# Patient Record
Sex: Female | Born: 2000 | Hispanic: Yes | Marital: Single | Smoking: Never smoker
Health system: Southern US, Community
[De-identification: ages and names within clinical notes are randomized; demographics above are authoritative.]

## PROBLEM LIST (undated history)

## (undated) DIAGNOSIS — Z973 Presence of spectacles and contact lenses: Secondary | ICD-10-CM

## (undated) DIAGNOSIS — R1900 Intra-abdominal and pelvic swelling, mass and lump, unspecified site: Secondary | ICD-10-CM

## (undated) HISTORY — PX: NO PAST SURGERIES: SHX2092

---
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
## 2019-07-29 DIAGNOSIS — Z419 Encounter for procedure for purposes other than remedying health state, unspecified: Secondary | ICD-10-CM | POA: Diagnosis not present

## 2019-08-29 DIAGNOSIS — Z419 Encounter for procedure for purposes other than remedying health state, unspecified: Secondary | ICD-10-CM | POA: Diagnosis not present

## 2019-09-29 DIAGNOSIS — Z419 Encounter for procedure for purposes other than remedying health state, unspecified: Secondary | ICD-10-CM | POA: Diagnosis not present

## 2019-10-29 DIAGNOSIS — Z419 Encounter for procedure for purposes other than remedying health state, unspecified: Secondary | ICD-10-CM | POA: Diagnosis not present

## 2019-11-29 DIAGNOSIS — Z419 Encounter for procedure for purposes other than remedying health state, unspecified: Secondary | ICD-10-CM | POA: Diagnosis not present

## 2019-12-29 DIAGNOSIS — Z419 Encounter for procedure for purposes other than remedying health state, unspecified: Secondary | ICD-10-CM | POA: Diagnosis not present

## 2020-01-29 DIAGNOSIS — Z419 Encounter for procedure for purposes other than remedying health state, unspecified: Secondary | ICD-10-CM | POA: Diagnosis not present

## 2020-02-11 DIAGNOSIS — Z1152 Encounter for screening for COVID-19: Secondary | ICD-10-CM | POA: Diagnosis not present

## 2020-02-29 DIAGNOSIS — Z419 Encounter for procedure for purposes other than remedying health state, unspecified: Secondary | ICD-10-CM | POA: Diagnosis not present

## 2020-03-28 DIAGNOSIS — Z419 Encounter for procedure for purposes other than remedying health state, unspecified: Secondary | ICD-10-CM | POA: Diagnosis not present

## 2020-04-28 DIAGNOSIS — Z419 Encounter for procedure for purposes other than remedying health state, unspecified: Secondary | ICD-10-CM | POA: Diagnosis not present

## 2020-05-28 DIAGNOSIS — Z419 Encounter for procedure for purposes other than remedying health state, unspecified: Secondary | ICD-10-CM | POA: Diagnosis not present

## 2020-06-28 DIAGNOSIS — Z419 Encounter for procedure for purposes other than remedying health state, unspecified: Secondary | ICD-10-CM | POA: Diagnosis not present

## 2020-07-28 DIAGNOSIS — Z419 Encounter for procedure for purposes other than remedying health state, unspecified: Secondary | ICD-10-CM | POA: Diagnosis not present

## 2020-08-28 DIAGNOSIS — Z419 Encounter for procedure for purposes other than remedying health state, unspecified: Secondary | ICD-10-CM | POA: Diagnosis not present

## 2020-09-28 DIAGNOSIS — Z419 Encounter for procedure for purposes other than remedying health state, unspecified: Secondary | ICD-10-CM | POA: Diagnosis not present

## 2020-10-28 DIAGNOSIS — Z419 Encounter for procedure for purposes other than remedying health state, unspecified: Secondary | ICD-10-CM | POA: Diagnosis not present

## 2020-11-28 DIAGNOSIS — Z419 Encounter for procedure for purposes other than remedying health state, unspecified: Secondary | ICD-10-CM | POA: Diagnosis not present

## 2020-12-10 ENCOUNTER — Encounter (HOSPITAL_COMMUNITY): Payer: Self-pay | Admitting: Emergency Medicine

## 2020-12-10 ENCOUNTER — Emergency Department (HOSPITAL_COMMUNITY)
Admission: EM | Admit: 2020-12-10 | Discharge: 2020-12-11 | Disposition: A | Discharge status: Home / Self Care | Payer: Medicaid Other | Attending: Emergency Medicine | Admitting: Emergency Medicine

## 2020-12-10 ENCOUNTER — Other Ambulatory Visit: Payer: Self-pay

## 2020-12-10 DIAGNOSIS — R110 Nausea: Secondary | ICD-10-CM | POA: Insufficient documentation

## 2020-12-10 DIAGNOSIS — N83201 Unspecified ovarian cyst, right side: Secondary | ICD-10-CM | POA: Diagnosis not present

## 2020-12-10 DIAGNOSIS — R1905 Periumbilic swelling, mass or lump: Secondary | ICD-10-CM | POA: Diagnosis not present

## 2020-12-10 DIAGNOSIS — N1330 Unspecified hydronephrosis: Secondary | ICD-10-CM | POA: Diagnosis not present

## 2020-12-10 DIAGNOSIS — R1033 Periumbilical pain: Secondary | ICD-10-CM | POA: Diagnosis present

## 2020-12-10 DIAGNOSIS — R1900 Intra-abdominal and pelvic swelling, mass and lump, unspecified site: Secondary | ICD-10-CM | POA: Diagnosis not present

## 2020-12-10 LAB — COMPREHENSIVE METABOLIC PANEL
ALT: 13 U/L (ref 0–44)
AST: 18 U/L (ref 15–41)
Albumin: 3.9 g/dL (ref 3.5–5.0)
Alkaline Phosphatase: 68 U/L (ref 38–126)
Anion gap: 7 (ref 5–15)
BUN: 13 mg/dL (ref 6–20)
CO2: 25 mmol/L (ref 22–32)
Calcium: 9.1 mg/dL (ref 8.9–10.3)
Chloride: 105 mmol/L (ref 98–111)
Creatinine, Ser: 0.65 mg/dL (ref 0.44–1.00)
GFR, Estimated: >60 mL/min (ref >60)
Glucose, Bld: 88 mg/dL (ref 70–99)
Potassium: 3.7 mmol/L (ref 3.5–5.1)
Sodium: 137 mmol/L (ref 135–145)
Total Bilirubin: 0.7 mg/dL (ref 0.3–1.2)
Total Protein: 7.6 g/dL (ref 6.5–8.1)

## 2020-12-10 LAB — CBC WITH DIFFERENTIAL/PLATELET
Abs Immature Granulocytes: 0.03 10*3/uL (ref 0.00–0.07)
Basophils Absolute: 0 10*3/uL (ref 0.0–0.1)
Basophils Relative: 0 %
Eosinophils Absolute: 0.1 10*3/uL (ref 0.0–0.5)
Eosinophils Relative: 1 %
HCT: 40.3 % (ref 36.0–46.0)
Hemoglobin: 12.4 g/dL (ref 12.0–15.0)
Immature Granulocytes: 0 %
Lymphocytes Relative: 26 %
Lymphs Abs: 2.7 10*3/uL (ref 0.7–4.0)
MCH: 24.2 pg — ABNORMAL LOW (ref 26.0–34.0)
MCHC: 30.8 g/dL (ref 30.0–36.0)
MCV: 78.6 fL — ABNORMAL LOW (ref 80.0–100.0)
Monocytes Absolute: 0.6 10*3/uL (ref 0.1–1.0)
Monocytes Relative: 6 %
Neutro Abs: 6.6 10*3/uL (ref 1.7–7.7)
Neutrophils Relative %: 67 %
Platelets: 383 10*3/uL (ref 150–400)
RBC: 5.13 MIL/uL — ABNORMAL HIGH (ref 3.87–5.11)
RDW: 14.2 % (ref 11.5–15.5)
WBC: 10.1 10*3/uL (ref 4.0–10.5)
nRBC: 0 % (ref 0.0–0.2)

## 2020-12-10 LAB — I-STAT BETA HCG BLOOD, ED (MC, WL, AP ONLY): I-stat hCG, quantitative: <5 m[IU]/mL (ref <5)

## 2020-12-10 LAB — LIPASE, BLOOD: Lipase: 39 U/L (ref 11–51)

## 2020-12-10 NOTE — ED Triage Notes (Signed)
Pt c/o "lump" to the right of her belly button when lying flat. States she noticed it one week ago. Denies nausea/vomiting/diarrhea.

## 2020-12-10 NOTE — ED Provider Notes (Signed)
Emergency Medicine Provider Triage Evaluation Note  Jocelyn Howard , a 20 y.o. female  was evaluated in triage.  Pt complains of "knot" to periumbilical region that she noticed 1 week ago.  She states that she noticed this while laying flat.  She believes it has gotten bigger in size since then.  She states it is painful when she lays flat on her stomach.  She denies any nausea, vomiting, diarrhea, constipation.  No previous abdominal surgeries.  Review of Systems  Positive: + abd pain/knot Negative: - nausea, vomiting, diarrhea, constipation, fevers  Physical Exam  BP (!) 155/90 (BP Location: Right Arm)   Pulse 86   Temp 98.3 F (36.8 C) (Oral)   Resp 16   LMP 11/20/2020   SpO2 100%  Gen:   Awake, no distress   Resp:  Normal effort  MSK:   Moves extremities without difficulty  Other:  Abd soft, non tender. Difficult to assess as pt cannot lay flat in triage chair.   Medical Decision Making  Medically screening exam initiated at 6:16 PM.  Appropriate orders placed.  Jocelyn Howard was informed that the remainder of the evaluation will be completed by another provider, this initial triage assessment does not replace that evaluation, and the importance of remaining in the ED until their evaluation is complete.     Eustaquio Maize, PA-C 12/10/20 1817    Godfrey Pick, MD 12/11/20 [phone]

## 2020-12-11 ENCOUNTER — Emergency Department (HOSPITAL_COMMUNITY): Payer: Medicaid Other

## 2020-12-11 DIAGNOSIS — N1330 Unspecified hydronephrosis: Secondary | ICD-10-CM | POA: Diagnosis not present

## 2020-12-11 DIAGNOSIS — N83201 Unspecified ovarian cyst, right side: Secondary | ICD-10-CM | POA: Diagnosis not present

## 2020-12-11 DIAGNOSIS — R1900 Intra-abdominal and pelvic swelling, mass and lump, unspecified site: Secondary | ICD-10-CM | POA: Diagnosis not present

## 2020-12-11 LAB — LACTATE DEHYDROGENASE: LDH: 153 U/L (ref 98–192)

## 2020-12-11 MED ORDER — ONDANSETRON HCL 4 MG PO TABS
4.0000 mg | ORAL_TABLET | Freq: Once | ORAL | Status: AC
  Administered 2020-12-11: 4 mg via ORAL
  Filled 2020-12-11: qty 1

## 2020-12-11 MED ORDER — IOHEXOL 300 MG/ML  SOLN
100.0000 mL | Freq: Once | INTRAMUSCULAR | Status: AC | PRN
  Administered 2020-12-11: 100 mL via INTRAVENOUS

## 2020-12-11 NOTE — ED Notes (Signed)
Patient transported to CT 

## 2020-12-11 NOTE — ED Provider Notes (Addendum)
[facility] EMERGENCY DEPARTMENT Provider Note   CSN: [id number] Arrival date & time: 12/10/20  1623     History Chief Complaint  Patient presents with   Abdominal Pain    Jocelyn Howard is a 20 y.o. female with no significant medical history who presents to ED complaining of a "knot" in her periumbilical region for the last week. Patient denies pain to area but states that it is uncomfortable and is worsened by lying flat on her stomach. 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 When asked what made the patient decide to come in to ED today, she stated she "just wanted to get it checked out". Patient endorses nausea. Patient states she still has an appetite, eating regular meals and using the bathroom normally. Patient denies vomiting, constipation, diarrhea, fever.    Abdominal Pain Associated symptoms: nausea   Associated symptoms: no chest pain, no constipation, no diarrhea, no shortness of breath and no vomiting       History reviewed. No pertinent past medical history.  There are no problems to display for this patient.   History reviewed. No pertinent surgical history.   OB History   No obstetric history on file.     No family history on file.     Home Medications Prior to Admission medications   Not on File    Allergies    Patient has no known allergies.  Review of Systems   Review of Systems  Constitutional:  Negative for appetite change.  Respiratory:  Negative for shortness of breath.   Cardiovascular:  Negative for chest pain.  Gastrointestinal:  Positive for abdominal pain and nausea. Negative for constipation, diarrhea and vomiting.  Skin:  Negative for color change.  All other systems reviewed and are negative.  Physical Exam Updated Vital Signs BP 131/83 (BP Location: Right Arm)   Pulse 82   Temp 97.9 F (36.6 C) (Oral)   Resp 17   LMP  11/20/2020   SpO2 99%   Physical Exam Vitals and nursing note reviewed.  Constitutional:      General: She is not in acute distress.    Appearance: She is not ill-appearing.  HENT:     Head: Normocephalic.  Cardiovascular:     Rate and Rhythm: Normal rate and regular rhythm.     Heart sounds: No murmur heard. Pulmonary:     Effort: Pulmonary effort is normal.     Breath sounds: Normal breath sounds. No wheezing.  Abdominal:     General: Abdomen is flat. Bowel sounds are normal. There is no distension.     Tenderness: There is no abdominal tenderness.  Skin:    General: Skin is warm and dry.     Capillary Refill: Capillary refill takes less than 2 seconds.  Neurological:     Mental Status: She is alert.  Psychiatric:        Mood and Affect: Mood normal.    ED Results / Procedures / Treatments   Labs (all labs ordered are listed, but only abnormal results are displayed) Labs Reviewed  CBC WITH DIFFERENTIAL/PLATELET - Abnormal; Notable for the following components:      Result Value   RBC 5.13 (*)    MCV 78.6 (*)    MCH 24.2 (*)    All other components within normal limits  COMPREHENSIVE METABOLIC PANEL  LIPASE, BLOOD  CEA  CA 125  LACTATE DEHYDROGENASE  AFP TUMOR MARKER  INHIBIN B  I-STAT BETA HCG BLOOD, ED ([facility], [facility], [facility] ONLY)    EKG None  Radiology CT ABDOMEN PELVIS W CONTRAST  Result Date: 12/11/2020 CLINICAL DATA:  Palpable tender abdominal mass for 1 week. EXAM: CT ABDOMEN AND PELVIS WITH CONTRAST TECHNIQUE: Multidetector CT imaging of the abdomen and pelvis was performed using the standard protocol following bolus administration of intravenous contrast. CONTRAST:  138mL OMNIPAQUE IOHEXOL 300 MG/ML  SOLN COMPARISON:  None. FINDINGS: Lower Chest: No acute findings. Hepatobiliary: No hepatic masses identified. Gallbladder is unremarkable. No evidence of biliary ductal dilatation. Pancreas:  No mass or inflammatory changes. Spleen: Within normal limits in size and  appearance. Adrenals/Urinary Tract: No masses identified. No evidence of ureteral calculi. Mild right hydroureteronephrosis is seen due to extrinsic compression of the pelvic portion of the ureter by the cystic adnexal mass described below. Stomach/Bowel: No evidence of obstruction, inflammatory process or abnormal fluid collections. Vascular/Lymphatic: No pathologically enlarged lymph nodes. No acute vascular findings. Reproductive: Normal appearance of uterus. A large cystic lesion is seen in the right lower abdomen and pelvis, which appears to arise from the right adnexa. This measures 16.6 x 14.5 cm. A small mural nodule is seen along the posterior wall measuring approximately 14 mm. No other complex features are seen. 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 No other pelvic masses identified. No evidence of free fluid. Other:  None. Musculoskeletal:  No suspicious bone lesions identified. IMPRESSION: 16.6 cm cystic lesion in right lower abdomen and pelvis with small mural soft tissue nodule. 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 GYN consultation recommended. Mild right hydroureteronephrosis due to extrinsic compression by the cystic adnexal mass. Electronically Signed   By: Marlaine Hind M.D.   On: 12/11/2020 10:32     Procedures Procedures   Medications Ordered in ED Medications  ondansetron (ZOFRAN) tablet 4 mg (4 mg Oral Given 12/11/20 0859)  iohexol (OMNIPAQUE) 300 MG/ML solution 100 mL (100 mLs Intravenous Contrast Given 12/11/20 1006)    ED Course  I have reviewed the triage vital signs and the nursing notes.  Pertinent labs & imaging results that were available during my care of the patient were reviewed by me and considered in my medical decision making (see chart for details).    MDM Rules/Calculators/A&P                          20 year old female presents with  periumbilical mass noticed in the last week.  Patient states that it was uncomfortable to lie flat on her bed so she decided to "come and get checked out.".  On examination patient has large size mass that extends from periumbilical region into right lower quadrant.  Mass is not tender to palpation. Due to location of mass differential diagnosis at this time includes hernia, uterine fibroids.  Patient will be sent for CT imaging.  CT imaging revealing of 16.6 cm cystic lesion in right lower abdomen and pelvis with small mural soft tissue nodule.  According to CT read off this is high suspicious for a right ovarian cystic neoplasm, likely a serous cystoadenoma or cystoadenofibroma.  GYN will be consulted.  Dr. Roselie Awkward with GYN has been consulted on this patient.  He advises her to follow-up outpatient with [facility] [place] [facility] for planning of any potential biopsies that  need to be completed.   Additional tumor markers including AFP, CA125, CEA, and inhibin B have been ordered as requested by GYN.   Results of scan of been explained to patient including the fact that we are not sure if this is a benign or cancerous mass.  The follow-up plan has been explained to her as well and she is agreeable to plan.  Patient is stable on discharge       Final Clinical Impression(s) / ED Diagnoses Final diagnoses:  Pelvic mass in female    Rx / DC Orders ED Discharge Orders     None        Azucena Cecil, [place] 12/11/20 1306         Azucena Cecil, [place] 12/14/20 1814    Lucrezia Starch, MD 12/15/20 2037

## 2020-12-11 NOTE — Discharge Instructions (Addendum)
Return to ED with any new or worsening symptoms such as intractable nausea or vomiting You should receive a phone call from GYN office, but if you do not within 2 days please call number attached to this discharge packet to schedule follow up appointment

## 2020-12-12 LAB — CEA: CEA: 0.9 ng/mL (ref 0.0–4.7)

## 2020-12-12 LAB — CA 125: Cancer Antigen (CA) 125: 10.4 U/mL (ref 0.0–38.1)

## 2020-12-12 LAB — AFP TUMOR MARKER: AFP, Serum, Tumor Marker: <1.8 ng/mL (ref 0.0–4.7)

## 2020-12-13 LAB — INHIBIN B: Inhibin B: 7.1 pg/mL

## 2020-12-28 DIAGNOSIS — Z419 Encounter for procedure for purposes other than remedying health state, unspecified: Secondary | ICD-10-CM | POA: Diagnosis not present

## 2021-01-09 ENCOUNTER — Encounter: Payer: Medicaid Other | Admitting: Family Medicine

## 2021-01-10 ENCOUNTER — Encounter (HOSPITAL_COMMUNITY): Payer: Self-pay | Admitting: Radiology

## 2021-01-28 DIAGNOSIS — Z419 Encounter for procedure for purposes other than remedying health state, unspecified: Secondary | ICD-10-CM | POA: Diagnosis not present

## 2021-02-16 ENCOUNTER — Encounter: Payer: Self-pay | Admitting: Obstetrics & Gynecology

## 2021-02-16 ENCOUNTER — Ambulatory Visit (INDEPENDENT_AMBULATORY_CARE_PROVIDER_SITE_OTHER): Payer: Medicaid Other | Admitting: Obstetrics & Gynecology

## 2021-02-16 ENCOUNTER — Other Ambulatory Visit: Payer: Self-pay

## 2021-02-16 VITALS — BP 138/77 | HR 62 | Wt 210.0 lb

## 2021-02-16 DIAGNOSIS — R1900 Intra-abdominal and pelvic swelling, mass and lump, unspecified site: Secondary | ICD-10-CM

## 2021-02-16 NOTE — H&P (View-Only) (Signed)
GYNECOLOGY OFFICE VISIT NOTE  History:   Jocelyn Howard is a 21 y.o. G0 here today for management of large pelvic cystic mass diagnosed during ER visit on 12/11/2020. 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 She had negative CA-125, CEA LDH, AFP, inhibin tumor markers.  Today, she denies any pain, still notices the mass especially when she lies flat. She is concerned it may be bigger.   She denies any abnormal vaginal discharge, bleeding, pelvic pain or other concerns.    History reviewed. No pertinent past medical history.  History reviewed. No pertinent surgical history.  The following portions of the patient's history were reviewed and updated as appropriate: allergies, current medications, past family history, past medical history, past social history, past surgical history and problem list.   Health Maintenance:  Never had a pap smear, deferred today.  Review of Systems:  Pertinent items noted in HPI and remainder of comprehensive ROS otherwise negative.  Physical Exam:  BP 138/77    Pulse 62    Wt 210 lb (95.3 kg)    LMP 02/08/2021  CONSTITUTIONAL: Well-developed, well-nourished female in no acute distress.  HEENT:  Normocephalic, atraumatic. External right and left ear normal. No scleral icterus.  NECK: Normal range of motion, supple, no masses noted on observation SKIN: No rash noted. Not diaphoretic. No erythema. No pallor. MUSCULOSKELETAL: Normal range of motion. No edema noted. NEUROLOGIC: Alert and oriented to person, place, and time. Normal muscle tone coordination. No cranial nerve deficit noted. PSYCHIATRIC: Normal mood and affect. Normal behavior. Normal judgment and thought content. CARDIOVASCULAR: Normal heart rate noted RESPIRATORY: Effort and breath sounds normal, no  problems with respiration noted ABDOMEN: Large mass palpated in right abdomen extending above umbilicus about 18 cm in size. Non tender, mobile. No other masses noted. No other overt distention noted.   PELVIC: Deferred  Labs and Imaging Results for orders placed or performed during the hospital encounter of 12/10/20 (from the past 2016 hour(s))  Comprehensive metabolic panel   Collection Time: 12/10/20  6:23 PM  Result Value Ref Range   Sodium 137 135 - 145 mmol/L   Potassium 3.7 3.5 - 5.1 mmol/L   Chloride 105 98 - 111 mmol/L   CO2 25 22 - 32 mmol/L   Glucose, Bld 88 70 - 99 mg/dL   BUN 13 6 - 20 mg/dL   Creatinine, Ser 0.65 0.44 - 1.00 mg/dL   Calcium 9.1 8.9 - 10.3 mg/dL   Total Protein 7.6 6.5 - 8.1 g/dL   Albumin 3.9 3.5 - 5.0 g/dL   AST 18 15 - 41 U/L   ALT 13 0 - 44 U/L   Alkaline Phosphatase 68 38 - 126 U/L   Total Bilirubin 0.7 0.3 - 1.2 mg/dL   GFR, Estimated >60 >60 mL/min   Anion gap 7 5 - 15  Lipase, blood   Collection Time: 12/10/20  6:23 PM  Result Value Ref Range   Lipase 39 11 - 51 U/L  CBC with Differential   Collection Time: 12/10/20  6:23 PM  Result Value Ref Range   WBC 10.1 4.0 - 10.5 K/uL   RBC 5.13 (H) 3.87 - 5.11 MIL/uL   Hemoglobin 12.4 12.0 - 15.0 g/dL   HCT 40.3 36.0 - 46.0 %   MCV 78.6 (L) 80.0 - 100.0 fL  MCH 24.2 (L) 26.0 - 34.0 pg   MCHC 30.8 30.0 - 36.0 g/dL   RDW 14.2 11.5 - 15.5 %   Platelets 383 150 - 400 K/uL   nRBC 0.0 0.0 - 0.2 %   Neutrophils Relative % 67 %   Neutro Abs 6.6 1.7 - 7.7 K/uL   Lymphocytes Relative 26 %   Lymphs Abs 2.7 0.7 - 4.0 K/uL   Monocytes Relative 6 %   Monocytes Absolute 0.6 0.1 - 1.0 K/uL   Eosinophils Relative 1 %   Eosinophils Absolute 0.1 0.0 - 0.5 K/uL   Basophils Relative 0 %   Basophils Absolute 0.0 0.0 - 0.1 K/uL   Immature Granulocytes 0 %   Abs Immature Granulocytes 0.03 0.00 - 0.07 K/uL  I-Stat Beta hCG blood, ED ([facility], [facility], [facility] only)   Collection Time: 12/10/20  6:27 PM  Result Value Ref  Range   I-stat hCG, quantitative <5.0 <5 mIU/mL   Comment 3          CEA   Collection Time: 12/11/20 11:02 AM  Result Value Ref Range   CEA 0.9 0.0 - 4.7 ng/mL  CA 125   Collection Time: 12/11/20 11:02 AM  Result Value Ref Range   Cancer Antigen (CA) 125 10.4 0.0 - 38.1 U/mL  Lactate dehydrogenase   Collection Time: 12/11/20 11:36 AM  Result Value Ref Range   LDH 153 98 - 192 U/L  Inhibin B   Collection Time: 12/11/20 11:36 AM  Result Value Ref Range   Inhibin B 7.1 pg/mL  AFP tumor marker   Collection Time: 12/11/20 11:36 AM  Result Value Ref Range   AFP, Serum, Tumor Marker <1.8 0.0 - 4.7 ng/mL    CT ABDOMEN PELVIS W CONTRAST  Result Date: 12/11/2020 CLINICAL DATA:  Palpable tender abdominal mass for 1 week. EXAM: CT ABDOMEN AND PELVIS WITH CONTRAST TECHNIQUE: Multidetector CT imaging of the abdomen and pelvis was performed using the standard protocol following bolus administration of intravenous contrast. CONTRAST:  144mL OMNIPAQUE IOHEXOL 300 MG/ML  SOLN COMPARISON:  None. FINDINGS: Lower Chest: No acute findings. Hepatobiliary: No hepatic masses identified. Gallbladder is unremarkable. No evidence of biliary ductal dilatation. Pancreas:  No mass or inflammatory changes. Spleen: Within normal limits in size and appearance. Adrenals/Urinary Tract: No masses identified. No evidence of ureteral calculi. Mild right hydroureteronephrosis is seen due to extrinsic compression of the pelvic portion of the ureter by the cystic adnexal mass described below. Stomach/Bowel: No evidence of obstruction, inflammatory process or abnormal fluid collections. Vascular/Lymphatic: No pathologically enlarged lymph nodes. No acute vascular findings. Reproductive: Normal appearance of uterus. A large cystic lesion is seen in the right lower abdomen and pelvis, which appears to arise from the right adnexa. This measures 16.6 x 14.5 cm. A small mural nodule is seen along the posterior wall measuring  approximately 14 mm. No other complex features are seen. 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 No other pelvic masses identified. No evidence of free fluid. Other:  None. Musculoskeletal:  No suspicious bone lesions identified. IMPRESSION: 16.6 cm cystic lesion in right lower abdomen and pelvis with small mural soft tissue nodule. 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 GYN consultation recommended. Mild right hydroureteronephrosis due to extrinsic compression by the cystic adnexal mass. Electronically Signed   By: Marlaine Hind M.D.   On: 12/11/2020 10:32       Assessment  and Plan:     1. Pelvic mass in female Discussed need for surgical management of this large lesion, but will get preoperative labs (other tumor markers) and ultrasound. Most likely of benign etiology. - HCG, Tumor Marker - Cancer antigen 19-9 - US PELVIC COMPLETE WITH TRANSVAGINAL; Future Proposed surgery is laparoscopic ovarian cystectomy, possible oophorectomy, possible salpingectomy, possible laparotomy. The risks of surgery were discussed in detail with the patient including but not limited to: bleeding which may require transfusion or reoperation; infection which may require prolonged hospitalization or re-hospitalization and antibiotic therapy; injury to bowel, bladder, ureters and major vessels or other surrounding organs which may lead to other procedures; formation of adhesions; need for additional procedures including laparotomy or subsequent procedures secondary to intraoperative injury or abnormal pathology; thromboembolic phenomenon; incisional problems and other postoperative or anesthesia complications.  Patient was told that the likelihood that her condition and symptoms will be treated effectively with this surgical management was very high. All questions were answered.  She was told that she will be  contacted by our surgical scheduler regarding the time and date of her surgery; routine preoperative instructions will be given to her by the preoperative nursing team.  Printed patient education handouts about the procedure were given to the patient to review at home.   Routine preventative health maintenance measures emphasized. Please refer to After Visit Summary for other counseling recommendations.   Return for Postoperative follow up and pap smear.    I spent 30 minutes dedicated to the care of this patient including pre-visit review of records, face to face time with the patient discussing her conditions and treatments and post visit orders.    Verita Schneiders, MD, [place] [facility] [employer], [place]

## 2021-02-16 NOTE — Addendum Note (Signed)
Addended by: Verita Schneiders A on: 02/16/2021 12:00 PM   Modules accepted: Orders

## 2021-02-16 NOTE — Progress Notes (Addendum)
GYNECOLOGY OFFICE VISIT NOTE  History:   Jocelyn Howard is a 21 y.o. G0 here today for management of large pelvic cystic mass diagnosed during ER visit on 12/11/2020. 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 She had negative CA-125, CEA LDH, AFP, inhibin tumor markers.  Today, she denies any pain, still notices the mass especially when she lies flat. She is concerned it may be bigger.   She denies any abnormal vaginal discharge, bleeding, pelvic pain or other concerns.    History reviewed. No pertinent past medical history.  History reviewed. No pertinent surgical history.  The following portions of the patient's history were reviewed and updated as appropriate: allergies, current medications, past family history, past medical history, past social history, past surgical history and problem list.   Health Maintenance:  Never had a pap smear, deferred today.  Review of Systems:  Pertinent items noted in HPI and remainder of comprehensive ROS otherwise negative.  Physical Exam:  BP 138/77    Pulse 62    Wt 210 lb (95.3 kg)    LMP 02/08/2021  CONSTITUTIONAL: Well-developed, well-nourished female in no acute distress.  HEENT:  Normocephalic, atraumatic. External right and left ear normal. No scleral icterus.  NECK: Normal range of motion, supple, no masses noted on observation SKIN: No rash noted. Not diaphoretic. No erythema. No pallor. MUSCULOSKELETAL: Normal range of motion. No edema noted. NEUROLOGIC: Alert and oriented to person, place, and time. Normal muscle tone coordination. No cranial nerve deficit noted. PSYCHIATRIC: Normal mood and affect. Normal behavior. Normal judgment and thought content. CARDIOVASCULAR: Normal heart rate noted RESPIRATORY: Effort and breath sounds normal, no  problems with respiration noted ABDOMEN: Large mass palpated in right abdomen extending above umbilicus about 18 cm in size. Non tender, mobile. No other masses noted. No other overt distention noted.   PELVIC: Deferred  Labs and Imaging Results for orders placed or performed during the hospital encounter of 12/10/20 (from the past 2016 hour(s))  Comprehensive metabolic panel   Collection Time: 12/10/20  6:23 PM  Result Value Ref Range   Sodium 137 135 - 145 mmol/L   Potassium 3.7 3.5 - 5.1 mmol/L   Chloride 105 98 - 111 mmol/L   CO2 25 22 - 32 mmol/L   Glucose, Bld 88 70 - 99 mg/dL   BUN 13 6 - 20 mg/dL   Creatinine, Ser 0.65 0.44 - 1.00 mg/dL   Calcium 9.1 8.9 - 10.3 mg/dL   Total Protein 7.6 6.5 - 8.1 g/dL   Albumin 3.9 3.5 - 5.0 g/dL   AST 18 15 - 41 U/L   ALT 13 0 - 44 U/L   Alkaline Phosphatase 68 38 - 126 U/L   Total Bilirubin 0.7 0.3 - 1.2 mg/dL   GFR, Estimated >60 >60 mL/min   Anion gap 7 5 - 15  Lipase, blood   Collection Time: 12/10/20  6:23 PM  Result Value Ref Range   Lipase 39 11 - 51 U/L  CBC with Differential   Collection Time: 12/10/20  6:23 PM  Result Value Ref Range   WBC 10.1 4.0 - 10.5 K/uL   RBC 5.13 (H) 3.87 - 5.11 MIL/uL   Hemoglobin 12.4 12.0 - 15.0 g/dL   HCT 40.3 36.0 - 46.0 %   MCV 78.6 (L) 80.0 - 100.0 fL  MCH 24.2 (L) 26.0 - 34.0 pg   MCHC 30.8 30.0 - 36.0 g/dL   RDW 14.2 11.5 - 15.5 %   Platelets 383 150 - 400 K/uL   nRBC 0.0 0.0 - 0.2 %   Neutrophils Relative % 67 %   Neutro Abs 6.6 1.7 - 7.7 K/uL   Lymphocytes Relative 26 %   Lymphs Abs 2.7 0.7 - 4.0 K/uL   Monocytes Relative 6 %   Monocytes Absolute 0.6 0.1 - 1.0 K/uL   Eosinophils Relative 1 %   Eosinophils Absolute 0.1 0.0 - 0.5 K/uL   Basophils Relative 0 %   Basophils Absolute 0.0 0.0 - 0.1 K/uL   Immature Granulocytes 0 %   Abs Immature Granulocytes 0.03 0.00 - 0.07 K/uL  I-Stat Beta hCG blood, ED ([facility], [facility], [facility] only)   Collection Time: 12/10/20  6:27 PM  Result Value Ref  Range   I-stat hCG, quantitative <5.0 <5 mIU/mL   Comment 3          CEA   Collection Time: 12/11/20 11:02 AM  Result Value Ref Range   CEA 0.9 0.0 - 4.7 ng/mL  CA 125   Collection Time: 12/11/20 11:02 AM  Result Value Ref Range   Cancer Antigen (CA) 125 10.4 0.0 - 38.1 U/mL  Lactate dehydrogenase   Collection Time: 12/11/20 11:36 AM  Result Value Ref Range   LDH 153 98 - 192 U/L  Inhibin B   Collection Time: 12/11/20 11:36 AM  Result Value Ref Range   Inhibin B 7.1 pg/mL  AFP tumor marker   Collection Time: 12/11/20 11:36 AM  Result Value Ref Range   AFP, Serum, Tumor Marker <1.8 0.0 - 4.7 ng/mL    CT ABDOMEN PELVIS W CONTRAST  Result Date: 12/11/2020 CLINICAL DATA:  Palpable tender abdominal mass for 1 week. EXAM: CT ABDOMEN AND PELVIS WITH CONTRAST TECHNIQUE: Multidetector CT imaging of the abdomen and pelvis was performed using the standard protocol following bolus administration of intravenous contrast. CONTRAST:  149mL OMNIPAQUE IOHEXOL 300 MG/ML  SOLN COMPARISON:  None. FINDINGS: Lower Chest: No acute findings. Hepatobiliary: No hepatic masses identified. Gallbladder is unremarkable. No evidence of biliary ductal dilatation. Pancreas:  No mass or inflammatory changes. Spleen: Within normal limits in size and appearance. Adrenals/Urinary Tract: No masses identified. No evidence of ureteral calculi. Mild right hydroureteronephrosis is seen due to extrinsic compression of the pelvic portion of the ureter by the cystic adnexal mass described below. Stomach/Bowel: No evidence of obstruction, inflammatory process or abnormal fluid collections. Vascular/Lymphatic: No pathologically enlarged lymph nodes. No acute vascular findings. Reproductive: Normal appearance of uterus. A large cystic lesion is seen in the right lower abdomen and pelvis, which appears to arise from the right adnexa. This measures 16.6 x 14.5 cm. A small mural nodule is seen along the posterior wall measuring  approximately 14 mm. No other complex features are seen. 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 No other pelvic masses identified. No evidence of free fluid. Other:  None. Musculoskeletal:  No suspicious bone lesions identified. IMPRESSION: 16.6 cm cystic lesion in right lower abdomen and pelvis with small mural soft tissue nodule. 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 GYN consultation recommended. Mild right hydroureteronephrosis due to extrinsic compression by the cystic adnexal mass. Electronically Signed   By: Marlaine Hind M.D.   On: 12/11/2020 10:32       Assessment  and Plan:     1. Pelvic mass in female Discussed need for surgical management of this large lesion, but will get preoperative labs (other tumor markers) and ultrasound. Most likely of benign etiology. - HCG, Tumor Marker - Cancer antigen 19-9 - US PELVIC COMPLETE WITH TRANSVAGINAL; Future Proposed surgery is laparoscopic ovarian cystectomy, possible oophorectomy, possible salpingectomy, possible laparotomy. The risks of surgery were discussed in detail with the patient including but not limited to: bleeding which may require transfusion or reoperation; infection which may require prolonged hospitalization or re-hospitalization and antibiotic therapy; injury to bowel, bladder, ureters and major vessels or other surrounding organs which may lead to other procedures; formation of adhesions; need for additional procedures including laparotomy or subsequent procedures secondary to intraoperative injury or abnormal pathology; thromboembolic phenomenon; incisional problems and other postoperative or anesthesia complications.  Patient was told that the likelihood that her condition and symptoms will be treated effectively with this surgical management was very high. All questions were answered.  She was told that she will be  contacted by our surgical scheduler regarding the time and date of her surgery; routine preoperative instructions will be given to her by the preoperative nursing team.  Printed patient education handouts about the procedure were given to the patient to review at home.   Routine preventative health maintenance measures emphasized. Please refer to After Visit Summary for other counseling recommendations.   Return for Postoperative follow up and pap smear.    I spent 30 minutes dedicated to the care of this patient including pre-visit review of records, face to face time with the patient discussing her conditions and treatments and post visit orders.    Verita Schneiders, MD, [place] for [facility], [place]

## 2021-02-17 LAB — CANCER ANTIGEN 19-9: CA 19-9: 5 U/mL (ref 0–35)

## 2021-02-17 LAB — BETA HCG QUANT (REF LAB): hCG Quant: <1 m[IU]/mL

## 2021-02-19 ENCOUNTER — Encounter: Payer: Self-pay | Admitting: Obstetrics & Gynecology

## 2021-02-19 DIAGNOSIS — R1900 Intra-abdominal and pelvic swelling, mass and lump, unspecified site: Secondary | ICD-10-CM | POA: Insufficient documentation

## 2021-02-19 HISTORY — DX: Intra-abdominal and pelvic swelling, mass and lump, unspecified site: R19.00

## 2021-02-22 ENCOUNTER — Other Ambulatory Visit: Payer: Self-pay | Admitting: Obstetrics & Gynecology

## 2021-02-22 ENCOUNTER — Ambulatory Visit (HOSPITAL_COMMUNITY)
Admission: RE | Admit: 2021-02-22 | Discharge: 2021-02-22 | Disposition: A | Discharge status: Home / Self Care | Payer: Medicaid Other | Source: Ambulatory Visit | Attending: Obstetrics & Gynecology | Admitting: Obstetrics & Gynecology

## 2021-02-22 ENCOUNTER — Other Ambulatory Visit: Payer: Self-pay

## 2021-02-22 DIAGNOSIS — R1900 Intra-abdominal and pelvic swelling, mass and lump, unspecified site: Secondary | ICD-10-CM | POA: Diagnosis not present

## 2021-02-22 DIAGNOSIS — N83202 Unspecified ovarian cyst, left side: Secondary | ICD-10-CM | POA: Diagnosis not present

## 2021-02-23 ENCOUNTER — Other Ambulatory Visit: Payer: Self-pay

## 2021-02-23 ENCOUNTER — Encounter (HOSPITAL_BASED_OUTPATIENT_CLINIC_OR_DEPARTMENT_OTHER): Payer: Self-pay | Admitting: Obstetrics & Gynecology

## 2021-02-23 NOTE — Progress Notes (Addendum)
Spoke w/ via phone for pre-op interview--- pt Lab needs dos----  urine preg (per anes)/  pre-op orders pending             Lab results------ no COVID test -----patient states asymptomatic no test needed Arrive at ------- 0630 on 02-28-2021 NPO after MN NO Solid Food.  Clear liquids from MN until--- 0530 Med rec completed Medications to take morning of surgery ----- none Diabetic medication ----- n/a Patient instructed no nail polish to be worn day of surgery Patient instructed to bring photo id and insurance card day of surgery Patient aware to have Driver (ride ) / caregiver for 24 hours after surgery -- mother, norma Patient Special Instructions ----- reviewed RCC and visitor guidelines Pre-Op special Istructions ----- sent inbox message to dr Harolyn Rutherford in epic , requested orders Patient verbalized understanding of instructions that were given at this phone interview. Patient denies shortness of breath, chest pain, fever, cough at this phone interview.

## 2021-02-27 NOTE — Anesthesia Preprocedure Evaluation (Addendum)
Anesthesia Evaluation  Patient identified by MRN, date of birth, ID band Patient awake    Reviewed: Allergy & Precautions, NPO status , Patient's Chart, lab work & pertinent test results  Airway Mallampati: II  TM Distance: >3 FB Neck ROM: Full    Dental no notable dental hx. (+) Dental Advisory Given Braces:   Pulmonary neg pulmonary ROS,    Pulmonary exam normal        Cardiovascular negative cardio ROS Normal cardiovascular exam     Neuro/Psych negative neurological ROS     GI/Hepatic negative GI ROS, Neg liver ROS,   Endo/Other  negative endocrine ROS  Renal/GU negative Renal ROS     Musculoskeletal negative musculoskeletal ROS (+)   Abdominal   Peds  Hematology negative hematology ROS (+)   Anesthesia Other Findings   Reproductive/Obstetrics                            Anesthesia Physical Anesthesia Plan  ASA: 2  Anesthesia Plan: General   Post-op Pain Management: Tylenol PO (pre-op) and Celebrex PO (pre-op)   Induction:   PONV Risk Score and Plan: 4 or greater and Ondansetron, Dexamethasone, Midazolam and Scopolamine patch - Pre-op  Airway Management Planned: Oral ETT  Additional Equipment:   Intra-op Plan:   Post-operative Plan: Extubation in OR  Informed Consent: I have reviewed the patients History and Physical, chart, labs and discussed the procedure including the risks, benefits and alternatives for the proposed anesthesia with the patient or authorized representative who has indicated his/her understanding and acceptance.       Plan Discussed with: Anesthesiologist and CRNA  Anesthesia Plan Comments:        Anesthesia Quick Evaluation

## 2021-02-28 ENCOUNTER — Encounter (HOSPITAL_BASED_OUTPATIENT_CLINIC_OR_DEPARTMENT_OTHER)
Admission: RE | Disposition: A | Discharge status: Home / Self Care | Payer: Self-pay | Source: Home / Self Care | Attending: Obstetrics & Gynecology

## 2021-02-28 ENCOUNTER — Ambulatory Visit (HOSPITAL_BASED_OUTPATIENT_CLINIC_OR_DEPARTMENT_OTHER): Payer: Medicaid Other | Admitting: Anesthesiology

## 2021-02-28 ENCOUNTER — Other Ambulatory Visit: Payer: Self-pay

## 2021-02-28 ENCOUNTER — Ambulatory Visit (HOSPITAL_BASED_OUTPATIENT_CLINIC_OR_DEPARTMENT_OTHER)
Admission: RE | Admit: 2021-02-28 | Discharge: 2021-02-28 | Disposition: A | Discharge status: Home / Self Care | Payer: Medicaid Other | Attending: Obstetrics & Gynecology | Admitting: Obstetrics & Gynecology

## 2021-02-28 ENCOUNTER — Encounter: Payer: Self-pay | Admitting: Obstetrics & Gynecology

## 2021-02-28 ENCOUNTER — Encounter (HOSPITAL_BASED_OUTPATIENT_CLINIC_OR_DEPARTMENT_OTHER): Payer: Self-pay | Admitting: Obstetrics & Gynecology

## 2021-02-28 DIAGNOSIS — Z419 Encounter for procedure for purposes other than remedying health state, unspecified: Secondary | ICD-10-CM | POA: Diagnosis not present

## 2021-02-28 DIAGNOSIS — R1900 Intra-abdominal and pelvic swelling, mass and lump, unspecified site: Secondary | ICD-10-CM

## 2021-02-28 DIAGNOSIS — D270 Benign neoplasm of right ovary: Secondary | ICD-10-CM | POA: Insufficient documentation

## 2021-02-28 DIAGNOSIS — N83201 Unspecified ovarian cyst, right side: Secondary | ICD-10-CM | POA: Diagnosis not present

## 2021-02-28 HISTORY — PX: LAPAROSCOPIC BILATERAL SALPINGO OOPHERECTOMY: SHX5890

## 2021-02-28 HISTORY — DX: Intra-abdominal and pelvic swelling, mass and lump, unspecified site: R19.00

## 2021-02-28 HISTORY — DX: Presence of spectacles and contact lenses: Z97.3

## 2021-02-28 LAB — CBC
HCT: 38.5 % (ref 36.0–46.0)
Hemoglobin: 11.9 g/dL — ABNORMAL LOW (ref 12.0–15.0)
MCH: 24.2 pg — ABNORMAL LOW (ref 26.0–34.0)
MCHC: 30.9 g/dL (ref 30.0–36.0)
MCV: 78.4 fL — ABNORMAL LOW (ref 80.0–100.0)
Platelets: 334 10*3/uL (ref 150–400)
RBC: 4.91 MIL/uL (ref 3.87–5.11)
RDW: 14.6 % (ref 11.5–15.5)
WBC: 9.1 10*3/uL (ref 4.0–10.5)
nRBC: 0 % (ref 0.0–0.2)

## 2021-02-28 LAB — POCT PREGNANCY, URINE: Preg Test, Ur: NEGATIVE

## 2021-02-28 SURGERY — SALPINGO-OOPHORECTOMY, BILATERAL, LAPAROSCOPIC
Anesthesia: General | Site: Abdomen

## 2021-02-28 MED ORDER — SODIUM CHLORIDE 0.9 % IV SOLN
2.0000 g | INTRAVENOUS | Status: AC
  Administered 2021-02-28: 2 g via INTRAVENOUS

## 2021-02-28 MED ORDER — SODIUM CHLORIDE 0.9 % IV SOLN
INTRAVENOUS | Status: AC
  Filled 2021-02-28: qty 2

## 2021-02-28 MED ORDER — FENTANYL CITRATE (PF) 100 MCG/2ML IJ SOLN
INTRAMUSCULAR | Status: DC | PRN
  Administered 2021-02-28 (×2): 100 ug via INTRAVENOUS
  Administered 2021-02-28: 50 ug via INTRAVENOUS
  Administered 2021-02-28: 100 ug via INTRAVENOUS

## 2021-02-28 MED ORDER — FENTANYL CITRATE (PF) 250 MCG/5ML IJ SOLN
INTRAMUSCULAR | Status: AC
  Filled 2021-02-28: qty 5

## 2021-02-28 MED ORDER — GABAPENTIN 300 MG PO CAPS
ORAL_CAPSULE | ORAL | Status: AC
  Filled 2021-02-28: qty 1

## 2021-02-28 MED ORDER — MIDAZOLAM HCL 5 MG/5ML IJ SOLN
INTRAMUSCULAR | Status: DC | PRN
Start: 2021-02-28 — End: 2021-02-28
  Administered 2021-02-28: 2 mg via INTRAVENOUS

## 2021-02-28 MED ORDER — DEXAMETHASONE SODIUM PHOSPHATE 10 MG/ML IJ SOLN
INTRAMUSCULAR | Status: DC | PRN
  Administered 2021-02-28: 10 mg via INTRAVENOUS

## 2021-02-28 MED ORDER — SCOPOLAMINE 1 MG/3DAYS TD PT72
1.0000 | MEDICATED_PATCH | TRANSDERMAL | Status: DC
  Administered 2021-02-28: 1.5 mg via TRANSDERMAL

## 2021-02-28 MED ORDER — CELECOXIB 200 MG PO CAPS
ORAL_CAPSULE | ORAL | Status: AC
  Filled 2021-02-28: qty 1

## 2021-02-28 MED ORDER — OXYCODONE HCL 5 MG PO TABS: 5.0000 mg | ORAL_TABLET | ORAL | 0 refills | Status: DC | PRN

## 2021-02-28 MED ORDER — CELECOXIB 200 MG PO CAPS
200.0000 mg | ORAL_CAPSULE | Freq: Once | ORAL | Status: AC
  Administered 2021-02-28: 200 mg via ORAL

## 2021-02-28 MED ORDER — SODIUM CHLORIDE 0.9 % IR SOLN
Status: DC | PRN
  Administered 2021-02-28: 1000 mL

## 2021-02-28 MED ORDER — ACETAMINOPHEN 500 MG PO TABS
1000.0000 mg | ORAL_TABLET | ORAL | Status: AC
  Administered 2021-02-28: 1000 mg via ORAL

## 2021-02-28 MED ORDER — GABAPENTIN 300 MG PO CAPS
300.0000 mg | ORAL_CAPSULE | ORAL | Status: AC
  Administered 2021-02-28: 300 mg via ORAL

## 2021-02-28 MED ORDER — DEXAMETHASONE SODIUM PHOSPHATE 10 MG/ML IJ SOLN
INTRAMUSCULAR | Status: AC
  Filled 2021-02-28: qty 1

## 2021-02-28 MED ORDER — POVIDONE-IODINE 10 % EX SWAB: 2.0000 "application " | Freq: Once | CUTANEOUS | Status: DC

## 2021-02-28 MED ORDER — LIDOCAINE HCL (CARDIAC) PF 100 MG/5ML IV SOSY
PREFILLED_SYRINGE | INTRAVENOUS | Status: DC | PRN
  Administered 2021-02-28: 50 mg via INTRAVENOUS

## 2021-02-28 MED ORDER — 0.9 % SODIUM CHLORIDE (POUR BTL) OPTIME
TOPICAL | Status: DC | PRN
  Administered 2021-02-28: 500 mL

## 2021-02-28 MED ORDER — ACETAMINOPHEN 500 MG PO TABS
ORAL_TABLET | ORAL | Status: AC
  Filled 2021-02-28: qty 2

## 2021-02-28 MED ORDER — DOCUSATE SODIUM 100 MG PO CAPS: 100.0000 mg | ORAL_CAPSULE | Freq: Two times a day (BID) | ORAL | 2 refills | Status: DC | PRN

## 2021-02-28 MED ORDER — ACETAMINOPHEN 500 MG PO TABS: 1000.0000 mg | ORAL_TABLET | Freq: Once | ORAL | Status: DC

## 2021-02-28 MED ORDER — PROMETHAZINE HCL 25 MG/ML IJ SOLN: 6.2500 mg | INTRAMUSCULAR | Status: DC | PRN

## 2021-02-28 MED ORDER — ROCURONIUM BROMIDE 100 MG/10ML IV SOLN
INTRAVENOUS | Status: DC | PRN
Start: 2021-02-28 — End: 2021-02-28
  Administered 2021-02-28: 100 mg via INTRAVENOUS

## 2021-02-28 MED ORDER — FENTANYL CITRATE (PF) 100 MCG/2ML IJ SOLN
25.0000 ug | INTRAMUSCULAR | Status: DC | PRN
  Administered 2021-02-28: 25 ug via INTRAVENOUS

## 2021-02-28 MED ORDER — SCOPOLAMINE 1 MG/3DAYS TD PT72
MEDICATED_PATCH | TRANSDERMAL | Status: AC
  Filled 2021-02-28: qty 1

## 2021-02-28 MED ORDER — IBUPROFEN 600 MG PO TABS: 600.0000 mg | ORAL_TABLET | Freq: Four times a day (QID) | ORAL | 2 refills | Status: DC | PRN

## 2021-02-28 MED ORDER — ONDANSETRON HCL 4 MG/2ML IJ SOLN
INTRAMUSCULAR | Status: AC
  Filled 2021-02-28: qty 2

## 2021-02-28 MED ORDER — AMISULPRIDE (ANTIEMETIC) 5 MG/2ML IV SOLN: 10.0000 mg | Freq: Once | INTRAVENOUS | Status: DC | PRN

## 2021-02-28 MED ORDER — LACTATED RINGERS IV SOLN: INTRAVENOUS | Status: DC

## 2021-02-28 MED ORDER — ONDANSETRON HCL 4 MG/2ML IJ SOLN
INTRAMUSCULAR | Status: DC | PRN
  Administered 2021-02-28: 4 mg via INTRAVENOUS

## 2021-02-28 MED ORDER — LIDOCAINE HCL (PF) 2 % IJ SOLN
INTRAMUSCULAR | Status: AC
  Filled 2021-02-28: qty 5

## 2021-02-28 MED ORDER — MIDAZOLAM HCL 2 MG/2ML IJ SOLN
INTRAMUSCULAR | Status: AC
  Filled 2021-02-28: qty 2

## 2021-02-28 MED ORDER — FENTANYL CITRATE (PF) 100 MCG/2ML IJ SOLN
INTRAMUSCULAR | Status: AC
  Filled 2021-02-28: qty 2

## 2021-02-28 MED ORDER — SUGAMMADEX SODIUM 500 MG/5ML IV SOLN
INTRAVENOUS | Status: DC | PRN
  Administered 2021-02-28 (×2): 200 mg via INTRAVENOUS

## 2021-02-28 MED ORDER — ROCURONIUM BROMIDE 10 MG/ML (PF) SYRINGE
PREFILLED_SYRINGE | INTRAVENOUS | Status: AC
  Filled 2021-02-28: qty 10

## 2021-02-28 MED ORDER — BUPIVACAINE HCL (PF) 0.5 % IJ SOLN
INTRAMUSCULAR | Status: DC | PRN
  Administered 2021-02-28: 14 mL
  Administered 2021-02-28: 10 mL
  Administered 2021-02-28: 6 mL

## 2021-02-28 MED ORDER — PROPOFOL 10 MG/ML IV BOLUS
INTRAVENOUS | Status: DC | PRN
  Administered 2021-02-28: 200 mg via INTRAVENOUS

## 2021-02-28 MED ORDER — PROPOFOL 500 MG/50ML IV EMUL
INTRAVENOUS | Status: AC
  Filled 2021-02-28: qty 50

## 2021-02-28 SURGICAL SUPPLY — 49 items
ADH SKN CLS APL DERMABOND .7 (GAUZE/BANDAGES/DRESSINGS) ×3
BAG SPEC RTRVL LRG 6X4 10 (ENDOMECHANICALS)
CNTNR URN SCR LID CUP LEK RST (MISCELLANEOUS) ×2 IMPLANT
CONT SPEC 4OZ STRL OR WHT (MISCELLANEOUS) ×4
COVER MAYO STAND STRL (DRAPES) ×5 IMPLANT
DECANTER SPIKE VIAL GLASS SM (MISCELLANEOUS) ×3 IMPLANT
DERMABOND ADVANCED (GAUZE/BANDAGES/DRESSINGS) ×1
DERMABOND ADVANCED .7 DNX12 (GAUZE/BANDAGES/DRESSINGS) ×4 IMPLANT
DURAPREP 26ML APPLICATOR (WOUND CARE) ×5 IMPLANT
GAUZE 4X4 16PLY ~~LOC~~+RFID DBL (SPONGE) ×10 IMPLANT
GLOVE SURG LTX SZ7 (GLOVE) ×5 IMPLANT
GLOVE SURG POLYISO LF SZ7 (GLOVE) ×3 IMPLANT
GLOVE SURG UNDER POLY LF SZ6.5 (GLOVE) ×6 IMPLANT
GLOVE SURG UNDER POLY LF SZ7 (GLOVE) ×20 IMPLANT
GLOVE SURG UNDER POLY LF SZ7.5 (GLOVE) ×3 IMPLANT
GOWN STRL REUS W/TWL LRG LVL3 (GOWN DISPOSABLE) ×13 IMPLANT
GOWN STRL REUS W/TWL XL LVL3 (GOWN DISPOSABLE) ×10 IMPLANT
IV NS 1000ML (IV SOLUTION) ×4
IV NS 1000ML BAXH (IV SOLUTION) ×2 IMPLANT
KIT TURNOVER CYSTO (KITS) ×5 IMPLANT
MANIFOLD NEPTUNE II (INSTRUMENTS) ×3 IMPLANT
NDL SPNL 18GX3.5 QUINCKE PK (NEEDLE) IMPLANT
NEEDLE HYPO 22GX1.5 SAFETY (NEEDLE) ×5 IMPLANT
NEEDLE SPNL 18GX3.5 QUINCKE PK (NEEDLE) ×4 IMPLANT
NS IRRIG 500ML POUR BTL (IV SOLUTION) ×3 IMPLANT
PACK LAPAROSCOPY BASIN (CUSTOM PROCEDURE TRAY) ×5 IMPLANT
PACK TRENDGUARD 450 HYBRID PRO (MISCELLANEOUS) ×2 IMPLANT
PAD ARMBOARD 7.5X6 YLW CONV (MISCELLANEOUS) ×10 IMPLANT
PAD OB MATERNITY 4.3X12.25 (PERSONAL CARE ITEMS) ×5 IMPLANT
POUCH SPECIMEN RETRIEVAL 10MM (ENDOMECHANICALS) IMPLANT
SET SUCTION IRRIG HYDROSURG (IRRIGATION / IRRIGATOR) ×3 IMPLANT
SET TUBE SMOKE EVAC HIGH FLOW (TUBING) ×5 IMPLANT
SHEARS HARMONIC ACE PLUS 36CM (ENDOMECHANICALS) ×3 IMPLANT
SLEEVE XCEL OPT CAN 5 100 (ENDOMECHANICALS) ×5 IMPLANT
SPONGE T-LAP 18X18 ~~LOC~~+RFID (SPONGE) ×10 IMPLANT
STAPLER VISISTAT 35W (STAPLE) IMPLANT
STRIP CLOSURE SKIN 1/2X4 (GAUZE/BANDAGES/DRESSINGS) ×5 IMPLANT
SUT CHROMIC 1 CT1 27 (SUTURE) ×3 IMPLANT
SUT MNCRL AB 4-0 PS2 18 (SUTURE) ×8 IMPLANT
SUT VICRYL 0 UR6 27IN ABS (SUTURE) ×6 IMPLANT
SYR 50ML LL SCALE MARK (SYRINGE) ×3 IMPLANT
SYR BULB IRRIG 60ML STRL (SYRINGE) ×5 IMPLANT
SYR CONTROL 10ML LL (SYRINGE) ×5 IMPLANT
TOWEL OR 17X26 10 PK STRL BLUE (TOWEL DISPOSABLE) ×7 IMPLANT
TRAY FOLEY W/BAG SLVR 14FR LF (SET/KITS/TRAYS/PACK) ×5 IMPLANT
TRENDGUARD 450 HYBRID PRO PACK (MISCELLANEOUS) ×4
TROCAR BALLN 12MMX100 BLUNT (TROCAR) ×3 IMPLANT
TROCAR BLADELESS OPT 5 100 (ENDOMECHANICALS) ×8 IMPLANT
TROCAR XCEL NON-BLD 11X100MML (ENDOMECHANICALS) ×5 IMPLANT

## 2021-02-28 NOTE — Anesthesia Procedure Notes (Signed)
Procedure Name: Intubation Date/Time: 02/28/2021 8:25 AM Performed by: Jonna Munro, CRNA Pre-anesthesia Checklist: Patient identified, Emergency Drugs available, Suction available, Patient being monitored and Timeout performed Patient Re-evaluated:Patient Re-evaluated prior to induction Oxygen Delivery Method: Circle system utilized Preoxygenation: Pre-oxygenation with 100% oxygen Induction Type: IV induction Ventilation: Mask ventilation without difficulty Laryngoscope Size: Mac and 3 Grade View: Grade I Tube type: Oral Tube size: 7.0 mm Number of attempts: 1 Airway Equipment and Method: Stylet Placement Confirmation: ETT inserted through vocal cords under direct vision, positive ETCO2 and breath sounds checked- equal and bilateral Secured at: 22 cm Tube secured with: Tape Dental Injury: Teeth and Oropharynx as per pre-operative assessment

## 2021-02-28 NOTE — Discharge Instructions (Addendum)
No acetaminophen/Tylenol until after 1:11p.m. today if needed for pain.    No ibuprofen, Advil, Aleve, Motrin, ketorolac, meloxicam, naproxen, or other NSAIDS until after 1:11p.m. today if needed for pain.      Post Anesthesia Home Care Instructions  Activity: Get plenty of rest for the remainder of the day. A responsible individual must stay with you for 24 hours following the procedure.  For the next 24 hours, DO NOT: -Drive a car -Paediatric nurse -Drink alcoholic beverages -Take any medication unless instructed by your physician -Make any legal decisions or sign important papers.  Meals: Start with liquid foods such as gelatin or soup. Progress to regular foods as tolerated. Avoid greasy, spicy, heavy foods. If nausea and/or vomiting occur, drink only clear liquids until the nausea and/or vomiting subsides. Call your physician if vomiting continues.  Special Instructions/Symptoms: Your throat may feel dry or sore from the anesthesia or the breathing tube placed in your throat during surgery. If this causes discomfort, gargle with warm salt water. The discomfort should disappear within 24 hours.  If you had a scopolamine patch placed behind your ear for the management of post- operative nausea and/or vomiting:  1. The medication in the patch is effective for 72 hours, after which it should be removed.  Wrap patch in a tissue and discard in the trash. Wash hands thoroughly with soap and water. 2. You may remove the patch earlier than 72 hours if you experience unpleasant side effects which may include dry mouth, dizziness or visual disturbances. 3. Avoid touching the patch. Wash your hands with soap and water after contact with the patch.        DISCHARGE INSTRUCTIONS: Laparoscopy  The following instructions have been prepared to help you care for yourself upon your return home today.  Wound care:  Do not get the incision wet for the first 24 hours. The incision should be  kept clean and dry.  The Band-Aids or dressings may be removed the day after surgery.  Should the incision become sore, red, and swollen after the first week, check with your doctor.  Personal hygiene:  Shower the day after your procedure.  Activity and limitations:  Do NOT drive or operate any equipment today.  Do NOT lift anything more than 15 pounds for 2-3 weeks after surgery.  Do NOT rest in bed all day.  Walking is encouraged. Walk each day, starting slowly with 5-minute walks 3 or 4 times a day. Slowly increase the length of your walks.  Walk up and down stairs slowly.  Do NOT do strenuous activities, such as golfing, playing tennis, bowling, running, biking, weight lifting, gardening, mowing, or vacuuming for 2-4 weeks. Ask your doctor when it is okay to start.  Diet: Eat a light meal as desired this evening. You may resume your usual diet tomorrow.  Return to work: This is dependent on the type of work you do. For the most part you can return to a desk job within a week of surgery. If you are more active at work, please discuss this with your doctor.  What to expect after your surgery: You may have a slight burning sensation when you urinate on the first day. You may have a very small amount of blood in the urine. Expect to have a small amount of vaginal discharge/light bleeding for 1-2 weeks. It is not unusual to have abdominal soreness and bruising for up to 2 weeks. You may be tired and need more rest for about  1 week. You may experience shoulder pain for 24-72 hours. Lying flat in bed may relieve it.  Call your doctor for any of the following:  Develop a fever of 100.4 or greater  Inability to urinate 6 hours after discharge from hospital  Severe pain not relieved by pain medications  Persistent of heavy bleeding at incision site  Redness or swelling around incision site after a week  Increasing nausea or vomiting

## 2021-02-28 NOTE — Op Note (Signed)
Jocelyn Howard PROCEDURE DATE: 02/28/2021  PREOPERATIVE DIAGNOSES: Symptomatic large right ovarian cyst POSTOPERATIVE DIAGNOSES: The same PROCEDURE: Laparoscopic right oophorectomy SURGEON:  Dr. Verita Schneiders ASSISTANT:  Dr. Aletha Halim. An experienced assistant was required given the standard of surgical care given the complexity of the case.  This assistant was needed for exposure, dissection, suctioning, retraction, instrument exchange, and for overall help during the procedure. ANESTHESIOLOGY TEAM: Anesthesiologist: Duane Boston, MD CRNA: Jonna Munro, CRNA   INDICATIONS: 21 y.o. G0 with aforementioned preoperative diagnoses here today for definitive surgical management.   Risks of surgery were discussed with the patient including but not limited to: bleeding which may require transfusion or reoperation; infection which may require antibiotics; injury to bowel, bladder, ureters or other surrounding organs; need for additional procedures including laparotomy or subsequent procedures secondary to abnormal pathology; thromboembolic phenomenon, incisional problems and other postoperative/anesthesia complications. Written informed consent was obtained.    FINDINGS:  Small uterus, enlarged  right adnexa with 17 cm cyst encompassing enitre ovary. Small solid nodule noted at base of cyst, about 1 cm in size.  Clear cyst aspirate noted, suctioned about 800 ml of fluid and some was sent for cytology.  Normal left adnexa.  No evidence of endometriosis, adhesions or any other abdominal/pelvic abnormality.  Normal upper abdomen.  ANESTHESIA:    General INTRAVENOUS FLUIDS: 1200 ml ESTIMATED BLOOD LOSS: 10 ml SPECIMENS: Right ovary sent to pathology and ovarian cyst aspirate sent to cytology COMPLICATIONS: None immediate  PROCEDURE IN DETAIL:  The patient received intravenous antibiotics and had sequential compression devices applied to her lower extremities while in the preoperative  area.  She was then taken to the operating room where general anesthesia was administered and was found to be adequate.  She was placed in the dorsal lithotomy position, and was prepped and draped in a sterile manner.  A Foley catheter was inserted into her bladder and attached to constant drainage and a uterine manipulator was then advanced into the uterus . After an adequate timeout was performed, attention was then turned to the patient's abdomen where a  1.5 cm skin incision was made under the umbilical fold. This was taken down to the level of fascia. The fascia was identified, grasped with Kocher clamps, incised and tagged with 0-Vicryl.  The enlarged cyst was recognized and using a large bore needle and large syringe, aspirate was collected for cytology.  Fluid was clear and copious. The incision was widened to accommodate the Nezhat suction apparatus, and about 800 ml of aspirate was recovered. The ovarian incision was then reapproximated with 1-0 chromic interrupted stitch and returned to the abdomen.  The Hassan 11-mm trocar and sleeve were then advanced without difficulty into the abdomen without difficulty, and intraabdominal placement was confirmed by the laparoscope.  The abdomen was then insufflated with carbon dioxide gas.  Adequate pneumoperitoneum was obtained.  A survey of the patient's pelvis and abdomen revealed the findings above. Bilateral 5-mm lower quadrant ports were then placed under direct visualization. On the right side, the uteroovarian ligament was then clamped and transected with the Harmonic device.  The right ovary was also separated from the fallopian  tube, by sequential clamping and transecting with the Harmonic device, allowing for right oophorectomy.  Excellent hemostasis was noted. The specimen was then removed from the abdomen through the umbilical site.  The operative site was surveyed, and it was found to be hemostatic.  No intraoperative injury to other surrounding organs  was noted.  The  abdomen was desufflated and all instruments were then removed from the patient's abdomen. The fascial incision of the umbilicus was closed with a 0 Vicryl running stitch.  All skin incisions were closed with 3-0 Monocryl subcuticular stitches and Dermabond.  The patient tolerated the procedure well.  Sponge, lap, and needle counts were correct times two.  The patient was then taken to the recovery room awake, extubated and in stable condition.  The patient will be discharged to home as per PACU criteria.  Routine postoperative instructions given.  She was prescribed Oxycodone, Ibuprofen and Colace.  She will follow up in the office in 2-3 weeks for postoperative evaluation.   Verita Schneiders, MD, [place] for [facility], [place]

## 2021-02-28 NOTE — Anesthesia Postprocedure Evaluation (Signed)
Anesthesia Post Note  Patient: Jocelyn Howard  Procedure(s) Performed: LAPAROSCOPIC RIGHT OOPHORECTOMY (Abdomen)     Patient location during evaluation: PACU Anesthesia Type: General Level of consciousness: sedated Pain management: pain level controlled Vital Signs Assessment: post-procedure vital signs reviewed and stable Respiratory status: spontaneous breathing and respiratory function stable Cardiovascular status: stable Postop Assessment: no apparent nausea or vomiting Anesthetic complications: no   No notable events documented.  Last Vitals:  Vitals:   02/28/21 1045 02/28/21 1055  BP: 127/74 124/64  Pulse: 77 72  Resp: 15 15  Temp:  36.7 C  SpO2: 100% 97%    Last Pain:  Vitals:   02/28/21 1055  TempSrc:   PainSc: [place]

## 2021-02-28 NOTE — Interval H&P Note (Signed)
History and Physical Interval Note 02/28/2021 8:06 AM  Jocelyn Howard  has presented today for surgery, with the diagnosis of large cystic pelvic mass.  The various methods of treatment have been discussed with the patient and family. After consideration of risks, benefits and other options for treatment, the patient has consented to  Procedure(s): LAPAROSCOPIC OVARIAN CYSTECTOMY, possible LAPAROSCOPIC  SALPINGO OOPHORECTOMY , possible MINI LAPAROTOMY as a surgical intervention.    Studies:  Latest Reference Range & Units 12/11/20 11:02 12/11/20 11:36 02/16/21 12:02  AFP, Serum, Tumor Marker 0.0 - 4.7 ng/mL  <1.8   CA 19-9 0 - 35 U/mL   5  Cancer Antigen (CA) 125 0.0 - 38.1 U/mL 10.4    CEA 0.0 - 4.7 ng/mL 0.9    hCG Quant mIU/mL   <1  Inhibin B pg/mL  7.1     US PELVIS (TRANSABDOMINAL ONLY)  Result Date: 02/23/2021 CLINICAL DATA:  Follow-up pelvic mass EXAM: TRANSABDOMINAL ULTRASOUND OF PELVIS TECHNIQUE: Transabdominal ultrasound examination of the pelvis was performed including evaluation of the uterus, ovaries, adnexal regions, and pelvic cul-de-sac. COMPARISON:  12/11/2020 FINDINGS: Uterus Measurements: 9.5 x 5.6 x 4.9 cm. = volume: 131 mL. No fibroids or other mass visualized. Endometrium Thickness: 7 mm.  No focal abnormality visualized. Right ovary Measurements: 17.3 x 12.7 cm. Large cystic appearing structure is noted measuring up to 14.3 cm. This corresponds to that seen on prior CT examination. Left ovary Measurements: 3.2 x 3.2 x 2.0 cm. = volume: 10.8 mL. 1.7 cm cyst is noted within the left ovary consistent with a small cyst. Other findings:  No abnormal free fluid. IMPRESSION: Stable cystic appearing structure arising in the region of the right adnexa. The previously seen mural nodule is not well appreciated on today's exam. This is similar to that seen on prior CT and again highly suspicious for ovarian cystic neoplasm. 1.7 cm left ovarian follicle, normal finding. No  follow-up imaging is recommended. Reference: Radiology 2019 Nov;293(2):359-371 Electronically Signed   By: Inez Catalina M.D.   On: 02/23/2021 21:27     The patient's history has been reviewed, patient examined, no change in status, stable for surgery.  I have reviewed the patient's chart and labs and recent ultrasound results and discussed with patient.  Questions were answered to the patient's satisfaction.  To OR when ready.   Verita Schneiders, MD, [place] for [facility], [place]

## 2021-02-28 NOTE — Transfer of Care (Signed)
Immediate Anesthesia Transfer of Care Note  Patient: Jocelyn Howard  Procedure(s) Performed: LAPAROSCOPIC RIGHT OOPHORECTOMY (Abdomen)  Patient Location: PACU  Anesthesia Type:General  Level of Consciousness: awake, alert , oriented and patient cooperative  Airway & Oxygen Therapy: Patient Spontanous Breathing and Patient connected to face mask oxygen  Post-op Assessment: Report given to RN, Post -op Vital signs reviewed and stable and Patient moving all extremities X 4  Post vital signs: Reviewed and stable  Last Vitals:  Vitals Value Taken Time  BP 128/69 02/28/21 1012  Temp    Pulse 88 02/28/21 1015  Resp 14 02/28/21 1015  SpO2 100 % 02/28/21 1015  Vitals shown include unvalidated device data.  Last Pain:  Vitals:   02/28/21 0707  TempSrc: Oral  PainSc: 0-No pain      Patients Stated Pain Goal: 8 (70/92/95 7473)  Complications: No notable events documented.

## 2021-03-01 LAB — CYTOLOGY - NON PAP

## 2021-03-01 LAB — SURGICAL PATHOLOGY

## 2021-03-01 NOTE — Progress Notes (Signed)
I called patient to check in after surgery yesterday, She reports she is doing well, no concerns. Discussed pathology report, showed benign serous cystadenofibroma.  All questions answered.  She is scheduled for her postoperative appointment with me in 03/28/2021, patient is aware of this appointment.  Verita Schneiders, MD

## 2021-03-02 ENCOUNTER — Encounter (HOSPITAL_BASED_OUTPATIENT_CLINIC_OR_DEPARTMENT_OTHER): Payer: Self-pay | Admitting: Obstetrics & Gynecology

## 2021-03-05 ENCOUNTER — Encounter: Payer: Self-pay | Admitting: Obstetrics & Gynecology

## 2021-03-28 ENCOUNTER — Encounter: Payer: Self-pay | Admitting: Obstetrics & Gynecology

## 2021-03-28 ENCOUNTER — Other Ambulatory Visit: Payer: Self-pay

## 2021-03-28 ENCOUNTER — Ambulatory Visit (INDEPENDENT_AMBULATORY_CARE_PROVIDER_SITE_OTHER): Payer: Medicaid Other | Admitting: Obstetrics & Gynecology

## 2021-03-28 VITALS — BP 118/66 | HR 73 | Wt 210.7 lb

## 2021-03-28 DIAGNOSIS — Z09 Encounter for follow-up examination after completed treatment for conditions other than malignant neoplasm: Secondary | ICD-10-CM | POA: Diagnosis not present

## 2021-03-28 DIAGNOSIS — Z7185 Encounter for immunization safety counseling: Secondary | ICD-10-CM | POA: Diagnosis not present

## 2021-03-28 DIAGNOSIS — Z419 Encounter for procedure for purposes other than remedying health state, unspecified: Secondary | ICD-10-CM | POA: Diagnosis not present

## 2021-03-28 NOTE — Progress Notes (Addendum)
? ?  GYNECOLOGY POSTOPERATIVE VISIT NOTE ? ?Subjective:  ?  ? Jocelyn Howard is a 21 y.o. G80 female who presents to the clinic 4 weeks status postl aparoscopic right oophorectomy for symptomatic large right ovarian cyst. Eating a regular diet without difficulty. Bowel movements are normal. The patient is not having any pain. ? ?The following portions of the patient's history were reviewed and updated as appropriate: allergies, current medications, past family history, past medical history, past social history, past surgical history, and problem list. ? ?Review of Systems ?Pertinent items noted in HPI and remainder of comprehensive ROS otherwise negative.  ?  ?Objective:  ? ? BP 118/66   Pulse 73   Wt 210 lb 11.2 oz (95.6 kg)   LMP 03/07/2021   BMI 38.54 kg/m?  ?General:  alert and no distress  ?Abdomen: soft, bowel sounds active, non-tender  ?Incision:   healing well, no drainage, no erythema, no hernia, no seroma, no swelling, no dehiscence, incision well approximated  ?Pelvic: Deferred  ?  ?02/28/2021 Surgical Pathology ?OVARY, RIGHT, AND CYST, OOPHORECTOMY:  ?- Serous cystadenofibroma.  ?- No endometriosis, borderline change or malignancy.  ?  ?Assessment:  ? ? Doing well postoperatively. ?Operative findings again reviewed. Pathology report discussed.  ?  ?Plan:  ? ? 1. Continue any current medications. ?2. Patient was offered to get pap smear today. She declined this. She is not sexually active, has never been. Counseled about HPV vaccine, information given to her. ?3. Activity restrictions: none ?4. Anticipated return to work: not applicable. ?5. Follow up as needed.  ? ? ?I spent 20 minutes dedicated to the care of this patient including pre-visit review of records, face to face time with the patient discussing her conditions and treatments. ? ?Verita Schneiders, MD, FACOG ?Obstetrician Social research officer, government, Faculty Practice ?[facility] ? ?

## 2021-04-17 DIAGNOSIS — M545 Low back pain, unspecified: Secondary | ICD-10-CM | POA: Diagnosis not present

## 2021-04-17 DIAGNOSIS — M542 Cervicalgia: Secondary | ICD-10-CM | POA: Diagnosis not present

## 2021-04-18 DIAGNOSIS — M545 Low back pain, unspecified: Secondary | ICD-10-CM | POA: Diagnosis not present

## 2021-04-18 DIAGNOSIS — M542 Cervicalgia: Secondary | ICD-10-CM | POA: Diagnosis not present

## 2021-04-25 ENCOUNTER — Encounter: Payer: Self-pay | Admitting: Obstetrics & Gynecology

## 2021-04-28 DIAGNOSIS — Z419 Encounter for procedure for purposes other than remedying health state, unspecified: Secondary | ICD-10-CM | POA: Diagnosis not present

## 2021-05-28 DIAGNOSIS — Z419 Encounter for procedure for purposes other than remedying health state, unspecified: Secondary | ICD-10-CM | POA: Diagnosis not present

## 2021-05-29 ENCOUNTER — Encounter (HOSPITAL_COMMUNITY): Payer: Self-pay

## 2021-05-29 ENCOUNTER — Ambulatory Visit (HOSPITAL_COMMUNITY)
Admission: EM | Admit: 2021-05-29 | Discharge: 2021-05-29 | Disposition: A | Discharge status: Home / Self Care | Payer: Medicaid Other | Attending: Nurse Practitioner | Admitting: Nurse Practitioner

## 2021-05-29 DIAGNOSIS — N926 Irregular menstruation, unspecified: Secondary | ICD-10-CM | POA: Diagnosis not present

## 2021-05-29 DIAGNOSIS — Z3202 Encounter for pregnancy test, result negative: Secondary | ICD-10-CM | POA: Diagnosis not present

## 2021-05-29 DIAGNOSIS — R140 Abdominal distension (gaseous): Secondary | ICD-10-CM | POA: Diagnosis not present

## 2021-05-29 LAB — HCG, QUANTITATIVE, PREGNANCY: hCG, Beta Chain, Quant, S: <1 m[IU]/mL (ref <5)

## 2021-05-29 LAB — POC URINE PREG, ED: Preg Test, Ur: NEGATIVE

## 2021-05-29 NOTE — ED Triage Notes (Signed)
Pt presents today requesting preg test. Notes MP is approx 3 days late and she has been experiencing more bloating than normal for her. Confirms some back pain and cramping. ?Negative at home preg test.  ?

## 2021-05-29 NOTE — ED Provider Notes (Signed)
?[facility] ? ? ? ?CSN: [id number] ?Arrival date & time: 05/29/21  0957 ? ? ?  ? ?History   ?Chief Complaint ?Chief Complaint  ?Patient presents with  ? Possible Pregnancy  ? ? ?HPI ?Jocelyn Howard is a 21 y.o. female.  ? ?Patient presents for pregnancy testing today.  She had a negative urine pregnancy test at home.  She reports recent sexual activity and is late for her menses by 4 days.  She also endorses bloating, some nausea, lower abdominal cramping, and little bit of back pain.  She denies breast pain/tenderness, dysuria, urinary urgency urinary frequency.  Denies any vaginal discharge. ? ?Of note about 2 months ago she did have a right salpingo-oophorectomy for a right ovarian cyst ? ? ?Past Medical History:  ?Diagnosis Date  ? History of COVID-19 2020  ? per pt mild symtpoms that resolved  ? Pelvic mass in female   ? Wears glasses   ? ? ?Patient Active Problem List  ? Diagnosis Date Noted  ? Pelvic mass in female 02/19/2021  ? ? ?Past Surgical History:  ?Procedure Laterality Date  ? LAPAROSCOPIC BILATERAL SALPINGO OOPHERECTOMY N/A 02/28/2021  ? Procedure: LAPAROSCOPIC RIGHT OOPHORECTOMY;  Surgeon: Osborne Oman, MD;  Location: [facility];  Service: Gynecology;  Laterality: N/A;  ? NO PAST SURGERIES    ? ? ?OB History   ?No obstetric history on file. ?  ? ? ? ?Home Medications   ? ?Prior to Admission medications   ?Not on File  ? ? ?Family History ?Family History  ?Problem Relation Age of Onset  ? Healthy Mother   ? Diabetes Father   ? ? ?Social History ?Social History  ? ?Tobacco Use  ? Smoking status: Never  ? Smokeless tobacco: Never  ?Vaping Use  ? Vaping Use: Some days  ? Substances: Nicotine  ? Devices: unsure  ?Substance Use Topics  ? Alcohol use: Yes  ?  Comment: occasional  ? Drug use: Yes  ?  Types: Marijuana  ?  Comment: occasional  ? ? ? ?Allergies   ?Patient has no known allergies. ? ? ?Review of Systems ?Review of Systems ?Per HPI ? ?Physical Exam ?Triage  Vital Signs ?ED Triage Vitals [05/29/21 1028]  ?Enc Vitals Group  ?   BP 125/71  ?   Pulse Rate 81  ?   Resp 18  ?   Temp 98.9 ?F (37.2 ?C)  ?   Temp Source Oral  ?   SpO2 100 %  ?   Weight   ?   Height   ?   Head Circumference   ?   Peak Flow   ?   Pain Score 7  ?   Pain Loc   ?   Pain Edu?   ?   Excl. in [place]?   ? ?No data found. ? ?Updated Vital Signs ?BP 125/71 (BP Location: Left Arm)   Pulse 81   Temp 98.9 ?F (37.2 ?C) (Oral)   Resp 18   LMP 05/03/2021 (Exact Date)   SpO2 100%  ? ?Visual Acuity ?Right Eye Distance:   ?Left Eye Distance:   ?Bilateral Distance:   ? ?Right Eye Near:   ?Left Eye Near:    ?Bilateral Near:    ? ?Physical Exam ?Vitals and nursing note reviewed.  ?Constitutional:   ?   General: She is not in acute distress. ?   Appearance: Normal appearance. She is not toxic-appearing.  ?Skin: ?   General:  Skin is warm and dry.  ?   Coloration: Skin is not jaundiced or pale.  ?   Findings: No erythema.  ?Neurological:  ?   Mental Status: She is alert and oriented to person, place, and time.  ?   Motor: No weakness.  ?   Gait: Gait normal.  ?Psychiatric:     ?   Mood and Affect: Mood normal.     ?   Behavior: Behavior is cooperative.  ? ? ? ?UC Treatments / Results  ?Labs ?(all labs ordered are listed, but only abnormal results are displayed) ?Labs Reviewed  ?HCG, QUANTITATIVE, PREGNANCY  ?POC URINE PREG, ED  ? ? ?EKG ? ? ?Radiology ?No results found. ? ?Procedures ?Procedures (including critical care time) ? ?Medications Ordered in UC ?Medications - No data to display ? ?Initial Impression / Assessment and Plan / UC Course  ?I have reviewed the triage vital signs and the nursing notes. ? ?Pertinent labs & imaging results that were available during my care of the patient were reviewed by me and considered in my medical decision making (see chart for details). ? ?  ?We will check quantitative pregnancy testing given symptoms and late for her menses by a couple of days.  If positive, encourage close  follow-up with OB/GYN.  If negative, she has a follow-up with OB/GYN for given symptoms as she has a recent history of right ovarian cyst with right oophorectomy. ?Final Clinical Impressions(s) / UC Diagnoses  ? ?Final diagnoses:  ?Missed period  ?Encounter for pregnancy test with result negative  ?Bloating  ? ? ? ?Discharge Instructions   ? ?  ?- Urine pregnancy test today was negative ?- If the HCG blood test is more than 1, this is consistent with pregnancy.   ?- Please follow up with your OB/GYN regarding your symptoms ? ? ? ? ?ED Prescriptions   ?None ?  ? ?PDMP not reviewed this encounter. ?  ?Eulogio Bear, NP ?05/29/21 1143 ? ?

## 2021-05-29 NOTE — Discharge Instructions (Addendum)
-   Urine pregnancy test today was negative ?- If the HCG blood test is more than 1, this is consistent with pregnancy.   ?- Please follow up with your OB/GYN regarding your symptoms ?

## 2021-06-04 ENCOUNTER — Encounter: Payer: Self-pay | Admitting: Family Medicine

## 2021-06-04 ENCOUNTER — Other Ambulatory Visit (HOSPITAL_COMMUNITY)
Admission: RE | Admit: 2021-06-04 | Discharge: 2021-06-04 | Disposition: A | Discharge status: Home / Self Care | Payer: Medicaid Other | Source: Ambulatory Visit | Attending: Family Medicine | Admitting: Family Medicine

## 2021-06-04 ENCOUNTER — Ambulatory Visit (INDEPENDENT_AMBULATORY_CARE_PROVIDER_SITE_OTHER): Payer: Medicaid Other | Admitting: Family Medicine

## 2021-06-04 VITALS — BP 143/67 | HR 67 | Wt 215.1 lb

## 2021-06-04 DIAGNOSIS — Z23 Encounter for immunization: Secondary | ICD-10-CM

## 2021-06-04 DIAGNOSIS — Z124 Encounter for screening for malignant neoplasm of cervix: Secondary | ICD-10-CM

## 2021-06-04 DIAGNOSIS — Z113 Encounter for screening for infections with a predominantly sexual mode of transmission: Secondary | ICD-10-CM | POA: Diagnosis not present

## 2021-06-04 NOTE — Progress Notes (Signed)
? ?  GYNECOLOGY OFFICE VISIT NOTE ? ?History:  ? Jocelyn Howard is a 21 y.o. female who presents for annual gynecologic exam. ? ?Doing well overall. LMP 06/01/21. Reports having regular periods overall. Had first intercourse about 1 month ago. Period was 6 days late after this and patient pursued testing to see if she was pregnant which was negative. She has not had intercourse since then. Currently menstruating. She does not wish to pursue birth control at this time. She plans to abstain. She denies vaginal irritation, vaginal discharge, or pelvic pain. She requests STI screening today.  ? ?Past Medical History:  ?Diagnosis Date  ? History of COVID-19 2020  ? per pt mild symtpoms that resolved  ? Pelvic mass in female   ? Wears glasses   ? ? ?Past Surgical History:  ?Procedure Laterality Date  ? LAPAROSCOPIC BILATERAL SALPINGO OOPHERECTOMY N/A 02/28/2021  ? Procedure: LAPAROSCOPIC RIGHT OOPHORECTOMY;  Surgeon: Osborne Oman, MD;  Location: [facility];  Service: Gynecology;  Laterality: N/A;  ? NO PAST SURGERIES    ? ? ?The following portions of the patient's history were reviewed and updated as appropriate: allergies, current medications, past family history, past medical history, past social history, past surgical history and problem list.  ? ?Health Maintenance:  Due for pap; will obtain today.  ? ?Review of Systems:  ?Pertinent items noted in HPI and remainder of comprehensive ROS otherwise negative. ? ?Physical Exam:  ?BP (!) 143/67   Pulse 67   Wt 215 lb 1.6 oz (97.6 kg)   LMP 06/01/2021 (Exact Date)   BMI 39.34 kg/m?  ? ?Performed in the presence of a chaperone. ? ?CONSTITUTIONAL: Well-developed, well-nourished female in no acute distress.  ?HEENT:  Normocephalic, atraumatic. EOMI, conjunctivae clear. ?CARDIOVASCULAR: Normal heart rate noted. ?RESPIRATORY: Normal work of breathing on room air.  ?PELVIC: Normal appearing external genitalia; normal urethral meatus; normal  appearing vaginal mucosa and cervix.  No abnormal discharge noted.  Bright red blood in vaginal vault in the setting of menses.   ?SKIN: No rashes or lesions noted. ?MUSCULOSKELETAL: Normal range of motion. No LE edema noted. ?NEUROLOGIC: Alert and oriented to person, place, and time. ?PSYCHIATRIC: Normal mood and affect. Normal behavior. Normal judgment and thought content. ? ?Assessment and Plan:  ? ?1. Cervical cancer screening ?Normal pelvic exam. Pap smear completed today. Will follow up results. Follow up annual exam in one year.  ?- Cytology - PAP( [facility]) ? ?2. Need for HPV vaccination ?Initial dose given today. Will follow up in 1-2 months for 2nd dose and at 6 months for 3rd dose.  ?- HPV 9-valent vaccine,Recombinat ? ?3. Routine screening for STI (sexually transmitted infection) ?Vaginal swabs obtained. No symptoms. Will follow up results and treat as indicated.  ?- Cervicovaginal ancillary only( ) ? ?Routine preventative health maintenance measures emphasized. ? ?Please refer to After Visit Summary for other counseling recommendations.  ? ?Return in about 1 year (around 06/05/2022) for next annual visit, sooner as needed. Follow up for next HPV vaccine in 1-2 months.  ? ?Vilma Meckel, MD ?OB Fellow, Faculty Practice ?[place] [facility] ?06/04/2021 1:54 PM ? ?

## 2021-06-05 ENCOUNTER — Other Ambulatory Visit: Payer: Self-pay | Admitting: Family Medicine

## 2021-06-05 DIAGNOSIS — B9689 Other specified bacterial agents as the cause of diseases classified elsewhere: Secondary | ICD-10-CM

## 2021-06-05 DIAGNOSIS — B3731 Acute candidiasis of vulva and vagina: Secondary | ICD-10-CM

## 2021-06-05 LAB — CERVICOVAGINAL ANCILLARY ONLY
Bacterial Vaginitis (gardnerella): POSITIVE — AB
Candida Glabrata: NEGATIVE
Candida Vaginitis: POSITIVE — AB
Chlamydia: NEGATIVE
Comment: NEGATIVE
Comment: NEGATIVE
Comment: NEGATIVE
Comment: NEGATIVE
Comment: NEGATIVE
Comment: NORMAL
Neisseria Gonorrhea: NEGATIVE
Trichomonas: NEGATIVE

## 2021-06-05 MED ORDER — FLUCONAZOLE 150 MG PO TABS: 150.0000 mg | ORAL_TABLET | Freq: Once | ORAL | 0 refills | Status: AC

## 2021-06-05 MED ORDER — METRONIDAZOLE 500 MG PO TABS: 500.0000 mg | ORAL_TABLET | Freq: Two times a day (BID) | ORAL | 0 refills | Status: AC

## 2021-06-11 LAB — CYTOLOGY - PAP
Chlamydia: NEGATIVE
Comment: NEGATIVE
Comment: NORMAL
Diagnosis: NEGATIVE
Diagnosis: REACTIVE
Neisseria Gonorrhea: NEGATIVE

## 2021-06-28 DIAGNOSIS — Z419 Encounter for procedure for purposes other than remedying health state, unspecified: Secondary | ICD-10-CM | POA: Diagnosis not present

## 2021-07-09 ENCOUNTER — Telehealth: Payer: Self-pay

## 2021-07-09 NOTE — Telephone Encounter (Signed)
Called pt in response to MyChart message. Pt states she has had new sexual partner, requests STD screening. Nurse visit appt scheduled for 07/11/21. Pt would like to discuss birth control. Encouraged pt to do research online and bring questions on 07/11/21. Explained we can schedule birth control consult for September once schedule opens in July.

## 2021-07-11 ENCOUNTER — Other Ambulatory Visit (HOSPITAL_COMMUNITY)
Admission: RE | Admit: 2021-07-11 | Discharge: 2021-07-11 | Disposition: A | Discharge status: Home / Self Care | Payer: Medicaid Other | Source: Ambulatory Visit | Attending: Family Medicine | Admitting: Family Medicine

## 2021-07-11 ENCOUNTER — Ambulatory Visit (INDEPENDENT_AMBULATORY_CARE_PROVIDER_SITE_OTHER): Payer: Medicaid Other

## 2021-07-11 VITALS — BP 115/58 | HR 79 | Wt 214.1 lb

## 2021-07-11 DIAGNOSIS — Z113 Encounter for screening for infections with a predominantly sexual mode of transmission: Secondary | ICD-10-CM | POA: Diagnosis not present

## 2021-07-11 NOTE — Progress Notes (Signed)
Patient here today requesting STD testing. I instructed patient on how to collect self swab. Self swab collected without issue. I informed patient we will notify her of any abnormal results. Patient verbalized understanding and denies any questions.   Paulina Fusi, RN 07/11/21

## 2021-07-12 LAB — CERVICOVAGINAL ANCILLARY ONLY
Chlamydia: NEGATIVE
Comment: NEGATIVE
Comment: NEGATIVE
Comment: NORMAL
Neisseria Gonorrhea: NEGATIVE
Trichomonas: NEGATIVE

## 2021-07-12 LAB — HEPATITIS B SURFACE ANTIGEN: Hepatitis B Surface Ag: NEGATIVE

## 2021-07-12 LAB — HEPATITIS C ANTIBODY: Hep C Virus Ab: NONREACTIVE

## 2021-07-12 LAB — HIV ANTIBODY (ROUTINE TESTING W REFLEX): HIV Screen 4th Generation wRfx: NONREACTIVE

## 2021-07-12 LAB — RPR: RPR Ser Ql: NONREACTIVE

## 2021-07-28 DIAGNOSIS — Z419 Encounter for procedure for purposes other than remedying health state, unspecified: Secondary | ICD-10-CM | POA: Diagnosis not present

## 2021-08-02 ENCOUNTER — Other Ambulatory Visit: Payer: Self-pay

## 2021-08-02 ENCOUNTER — Ambulatory Visit (INDEPENDENT_AMBULATORY_CARE_PROVIDER_SITE_OTHER): Payer: Medicaid Other

## 2021-08-02 VITALS — BP 119/72 | HR 75 | Wt 215.0 lb

## 2021-08-02 DIAGNOSIS — Z23 Encounter for immunization: Secondary | ICD-10-CM | POA: Diagnosis not present

## 2021-08-02 NOTE — Progress Notes (Addendum)
Elanore Altamirano-Moreno here for  Gardasil   Injection.  Injection administered without complication. Patient will return in  4 months  for next injection.  Annabell Howells, RN 08/02/2021  1:36 PM

## 2021-08-28 DIAGNOSIS — Z419 Encounter for procedure for purposes other than remedying health state, unspecified: Secondary | ICD-10-CM | POA: Diagnosis not present

## 2021-09-20 ENCOUNTER — Ambulatory Visit (INDEPENDENT_AMBULATORY_CARE_PROVIDER_SITE_OTHER): Payer: Medicaid Other | Admitting: *Deleted

## 2021-09-20 ENCOUNTER — Other Ambulatory Visit (HOSPITAL_COMMUNITY)
Admission: RE | Admit: 2021-09-20 | Discharge: 2021-09-20 | Disposition: A | Discharge status: Home / Self Care | Payer: Medicaid Other | Source: Ambulatory Visit | Attending: Family Medicine | Admitting: Family Medicine

## 2021-09-20 ENCOUNTER — Other Ambulatory Visit: Payer: Self-pay

## 2021-09-20 VITALS — BP 119/77 | HR 68 | Ht 62.0 in | Wt 209.0 lb

## 2021-09-20 DIAGNOSIS — Z202 Contact with and (suspected) exposure to infections with a predominantly sexual mode of transmission: Secondary | ICD-10-CM

## 2021-09-20 DIAGNOSIS — N898 Other specified noninflammatory disorders of vagina: Secondary | ICD-10-CM

## 2021-09-20 NOTE — Progress Notes (Signed)
Here for nurse visit. States has new sexual partner and having unusual vaginal odor, wants to get checked for std's, BV, yeast. Instructed to collect self swab and informed will be notified if + and needs treatment. She voices understanding. Staci Acosta

## 2021-09-21 ENCOUNTER — Other Ambulatory Visit: Payer: Self-pay | Admitting: Family Medicine

## 2021-09-21 DIAGNOSIS — B9689 Other specified bacterial agents as the cause of diseases classified elsewhere: Secondary | ICD-10-CM

## 2021-09-21 LAB — CERVICOVAGINAL ANCILLARY ONLY
Bacterial Vaginitis (gardnerella): POSITIVE — AB
Candida Glabrata: NEGATIVE
Candida Vaginitis: NEGATIVE
Chlamydia: NEGATIVE
Comment: NEGATIVE
Comment: NEGATIVE
Comment: NEGATIVE
Comment: NEGATIVE
Comment: NEGATIVE
Comment: NORMAL
Neisseria Gonorrhea: NEGATIVE
Trichomonas: NEGATIVE

## 2021-09-21 MED ORDER — METRONIDAZOLE 500 MG PO TABS: 500.0000 mg | ORAL_TABLET | Freq: Two times a day (BID) | ORAL | 0 refills | Status: DC

## 2021-09-26 ENCOUNTER — Encounter: Payer: Self-pay | Admitting: Obstetrics & Gynecology

## 2021-09-28 DIAGNOSIS — Z419 Encounter for procedure for purposes other than remedying health state, unspecified: Secondary | ICD-10-CM | POA: Diagnosis not present

## 2021-10-22 ENCOUNTER — Other Ambulatory Visit (HOSPITAL_COMMUNITY)
Admission: RE | Admit: 2021-10-22 | Discharge: 2021-10-22 | Disposition: A | Discharge status: Home / Self Care | Payer: Medicaid Other | Source: Ambulatory Visit | Attending: Family Medicine | Admitting: Family Medicine

## 2021-10-22 ENCOUNTER — Other Ambulatory Visit: Payer: Self-pay

## 2021-10-22 ENCOUNTER — Ambulatory Visit (INDEPENDENT_AMBULATORY_CARE_PROVIDER_SITE_OTHER): Payer: Medicaid Other

## 2021-10-22 VITALS — BP 138/85 | HR 97 | Wt 207.1 lb

## 2021-10-22 DIAGNOSIS — N898 Other specified noninflammatory disorders of vagina: Secondary | ICD-10-CM | POA: Insufficient documentation

## 2021-10-22 NOTE — Patient Instructions (Signed)
Non-medication Remedies for Bacterial Vaginosis   Option #1   1 Tbsp Fractitionated Coconut Oil  10 drops of Melaleuca (Tea Tree) Oil   Mix ingredients together well.  Soak 3-4 tampons (in applicators) in that mixture until all or mostly all mixture is soaked up into the tampons.  Insert 1 saturated tampon vaginally and wear overnight for 3-4 nights.   You can purchase Tea Tree Oil locally at:   [employer]  [address] [address]   [place] [address]      Option #2  Fill tub with enough to cover lap/lower abdomen warm water.  Mix 1/2 cup of baking soda or a few cups of apple cider vinegar into water. Soak in bath for at least 20 minutes. Be sure to swish water in between legs to get as much in vagina as possible. This soak can be done after sexual intercourse and menstrual cycles.      Unscented is best! (and other things to remember) Soap: UNSCENTED Dove (white box light green writing)  Laundry detergent- unscented  Sanitary napkin/panty liners: UNSCENTED.  If it doesn't SAY unscented it can have a scent/perfume    NO PERFUMES OR LOTIONS in the vulvar area Condoms: Non dyed, non flavored.   Remember to change wash cloths and towels frequently to prevent reinfection! Wear loose clothes to sleep.

## 2021-10-22 NOTE — Progress Notes (Signed)
Here today with complaint of continued abnormal vaginal discharge. Per chart review pt tested positive for BV 09/20/21. Reports completing Flagyl antibiotic course with no improvement. Describes tan vaginal discharge with abnormal odor, unable to describe odor. Endorses unprotected intercourse since last vaginal swab. Desires self swab today. Specimen collected by patient. Explained pt will be contacted with abnormal results.   Pt reports menstrual period is 3 days late. Reviewed there can be some normal variation in cycle length, especially based on stress level, exercise amount, or other factors. Recommended patient take home UPT if period is 7 + days late.  Apolonio Schneiders RN  10/22/21

## 2021-10-24 ENCOUNTER — Ambulatory Visit (INDEPENDENT_AMBULATORY_CARE_PROVIDER_SITE_OTHER): Payer: Medicaid Other

## 2021-10-24 ENCOUNTER — Telehealth: Payer: Self-pay | Admitting: General Practice

## 2021-10-24 DIAGNOSIS — Z3201 Encounter for pregnancy test, result positive: Secondary | ICD-10-CM

## 2021-10-24 DIAGNOSIS — B379 Candidiasis, unspecified: Secondary | ICD-10-CM

## 2021-10-24 DIAGNOSIS — Z3200 Encounter for pregnancy test, result unknown: Secondary | ICD-10-CM

## 2021-10-24 LAB — POCT PREGNANCY, URINE: Preg Test, Ur: POSITIVE — AB

## 2021-10-24 LAB — CERVICOVAGINAL ANCILLARY ONLY
Bacterial Vaginitis (gardnerella): NEGATIVE
Candida Glabrata: NEGATIVE
Candida Vaginitis: POSITIVE — AB
Chlamydia: NEGATIVE
Comment: NEGATIVE
Comment: NEGATIVE
Comment: NEGATIVE
Comment: NEGATIVE
Comment: NEGATIVE
Comment: NORMAL
Neisseria Gonorrhea: NEGATIVE
Trichomonas: NEGATIVE

## 2021-10-24 MED ORDER — TERCONAZOLE 0.4 % VA CREA: 1.0000 | TOPICAL_CREAM | Freq: Every day | VAGINAL | 0 refills | Status: DC

## 2021-10-24 NOTE — Telephone Encounter (Signed)
Called patient, no answer- will send mychart message.

## 2021-10-24 NOTE — Progress Notes (Signed)
Possible Pregnancy  Here today for pregnancy confirmation following positive home UPT yesterday. UPT in office today is positive.  Reviewed dating with patient:   LMP: 09/20/21 EDD: 06/27/22 4w 6d today  Reports regular, monthly menstrual period. OB history reviewed; this is first pregnancy. Reviewed medications and allergies with patient. This is an unplanned and undesired pregnancy. Pt desires abortion. Reviewed area options for abortion. Offered appointment with provider to discuss further, pt declines. Encouraged pt to follow up with our office as needed.  Annabell Howells, RN 10/24/2021  2:03 PM

## 2021-10-24 NOTE — Telephone Encounter (Signed)
-----   Message from Renee Harder, CNM sent at 10/24/2021  1:50 PM EDT ----- Patient tested positive for yeast. Can send in terazol.  Thanks! Andee Poles, CNM

## 2021-10-28 DIAGNOSIS — Z419 Encounter for procedure for purposes other than remedying health state, unspecified: Secondary | ICD-10-CM | POA: Diagnosis not present

## 2021-11-28 ENCOUNTER — Ambulatory Visit (INDEPENDENT_AMBULATORY_CARE_PROVIDER_SITE_OTHER): Payer: Medicaid Other

## 2021-11-28 ENCOUNTER — Other Ambulatory Visit: Payer: Self-pay

## 2021-11-28 ENCOUNTER — Other Ambulatory Visit (HOSPITAL_COMMUNITY)
Admission: RE | Admit: 2021-11-28 | Discharge: 2021-11-28 | Disposition: A | Discharge status: Home / Self Care | Payer: Medicaid Other | Source: Ambulatory Visit | Attending: Family Medicine | Admitting: Family Medicine

## 2021-11-28 VITALS — BP 123/71 | HR 72 | Wt 204.6 lb

## 2021-11-28 DIAGNOSIS — N898 Other specified noninflammatory disorders of vagina: Secondary | ICD-10-CM | POA: Insufficient documentation

## 2021-11-28 DIAGNOSIS — Z419 Encounter for procedure for purposes other than remedying health state, unspecified: Secondary | ICD-10-CM | POA: Diagnosis not present

## 2021-11-28 NOTE — Progress Notes (Signed)
Jocelyn Howard is here with complaint of vaginal odor; describes "metal, iron" odor. Pt has never smelled this odor before. No abnormal vaginal discharge. Patient reports this has been present since vaginal bleeding stopped following abortion on 11/02/22. Self swab instructions given and specimen obtained. Explained patient will be contacted with any abnormal results. Pt asks about Boric Acid for treatment of BV; non medication prevention/treatment of BV provided. Pt does not desire pregnancy at this time, declines birth control counseling.  Annabell Howells, RN 11/28/2021  2:28 PM

## 2021-11-28 NOTE — Patient Instructions (Signed)
At Home Vaginal BV Prevention: If engaging in intercourse without a barrier method, consider using semen sponges afterward. You can buy them at [employer] Begin taking a daily probiotic  Use a gentle antibacterial soap on your vulva (never in the vagina), such as Alaffia's Liquid Black Soap with Tea Tree. Avoid scented or harsh soaps. Use a "tea tree tampon" once a month: Melt 1/4c virgin coconut oil, add 1-2 drops of quality tea tree oil.  Soak a tampon in the mixture  Insert overnight the day after your period has ended. 5.   Place a '30mg'$  boric acid tablet vaginally every night for 21 nights, then weekly for        9 weeks.

## 2021-11-30 LAB — CERVICOVAGINAL ANCILLARY ONLY
Bacterial Vaginitis (gardnerella): NEGATIVE
Candida Glabrata: NEGATIVE
Candida Vaginitis: NEGATIVE
Chlamydia: NEGATIVE
Comment: NEGATIVE
Comment: NEGATIVE
Comment: NEGATIVE
Comment: NEGATIVE
Comment: NEGATIVE
Comment: NORMAL
Neisseria Gonorrhea: NEGATIVE
Trichomonas: NEGATIVE

## 2021-12-06 ENCOUNTER — Other Ambulatory Visit: Payer: Self-pay

## 2021-12-06 ENCOUNTER — Ambulatory Visit (INDEPENDENT_AMBULATORY_CARE_PROVIDER_SITE_OTHER): Payer: Medicaid Other

## 2021-12-06 VITALS — BP 119/67 | HR 79 | Ht 62.0 in | Wt 203.0 lb

## 2021-12-06 DIAGNOSIS — Z23 Encounter for immunization: Secondary | ICD-10-CM

## 2021-12-06 NOTE — Progress Notes (Signed)
Jocelyn Howard here for  3rd and final HPV vaccine.  Injection administered without complication. Pt states had IAB in 11/01/2021- [facility].  [name], RNC

## 2021-12-28 DIAGNOSIS — Z419 Encounter for procedure for purposes other than remedying health state, unspecified: Secondary | ICD-10-CM | POA: Diagnosis not present

## 2022-01-03 ENCOUNTER — Ambulatory Visit (HOSPITAL_COMMUNITY)
Admission: RE | Admit: 2022-01-03 | Discharge: 2022-01-03 | Disposition: A | Discharge status: Home / Self Care | Payer: Medicaid Other | Source: Ambulatory Visit | Attending: Internal Medicine | Admitting: Internal Medicine

## 2022-01-03 ENCOUNTER — Encounter (HOSPITAL_COMMUNITY): Payer: Self-pay

## 2022-01-03 VITALS — BP 159/91 | HR 91 | Temp 98.5°F | Resp 16

## 2022-01-03 DIAGNOSIS — R0981 Nasal congestion: Secondary | ICD-10-CM

## 2022-01-03 DIAGNOSIS — R062 Wheezing: Secondary | ICD-10-CM | POA: Diagnosis not present

## 2022-01-03 DIAGNOSIS — J209 Acute bronchitis, unspecified: Secondary | ICD-10-CM

## 2022-01-03 DIAGNOSIS — J029 Acute pharyngitis, unspecified: Secondary | ICD-10-CM | POA: Diagnosis not present

## 2022-01-03 DIAGNOSIS — R051 Acute cough: Secondary | ICD-10-CM

## 2022-01-03 MED ORDER — ALBUTEROL SULFATE HFA 108 (90 BASE) MCG/ACT IN AERS
2.0000 | INHALATION_SPRAY | Freq: Once | RESPIRATORY_TRACT | Status: AC
  Administered 2022-01-03: 2 via RESPIRATORY_TRACT

## 2022-01-03 MED ORDER — ALBUTEROL SULFATE HFA 108 (90 BASE) MCG/ACT IN AERS: 1.0000 | INHALATION_SPRAY | Freq: Four times a day (QID) | RESPIRATORY_TRACT | 0 refills | Status: DC | PRN

## 2022-01-03 MED ORDER — DEXAMETHASONE SODIUM PHOSPHATE 10 MG/ML IJ SOLN
10.0000 mg | Freq: Once | INTRAMUSCULAR | Status: AC
Start: 2022-01-03 — End: 2022-01-03
  Administered 2022-01-03: 10 mg via INTRAMUSCULAR

## 2022-01-03 MED ORDER — DEXAMETHASONE SODIUM PHOSPHATE 10 MG/ML IJ SOLN
INTRAMUSCULAR | Status: AC
  Filled 2022-01-03: qty 1

## 2022-01-03 MED ORDER — ALBUTEROL SULFATE HFA 108 (90 BASE) MCG/ACT IN AERS
INHALATION_SPRAY | RESPIRATORY_TRACT | Status: AC
  Filled 2022-01-03: qty 6.7

## 2022-01-03 MED ORDER — PROMETHAZINE-DM 6.25-15 MG/5ML PO SYRP: 5.0000 mL | ORAL_SOLUTION | Freq: Every evening | ORAL | 0 refills | Status: DC | PRN

## 2022-01-03 NOTE — Discharge Instructions (Signed)
You have bronchitis which is inflammation of the upper airways in your lungs.  We gave you a steroid shot in the clinic to help with your breathing and inflammation in your lungs.  Do not take any ibuprofen until tomorrow afternoon or evening due to the steroid shot.  The steroid injection will help you over the next 1 to 2 days to help reduce inflammation in your lungs.  You may use albuterol inhaler 1 to 2 puffs every 4-6 hours as needed for cough, shortness of breath, and wheezing.  You may use Promethazine DM cough syrup at bedtime as needed.  Do not use this during the day, if you go to work, or drink alcohol when using this as it can make you very sleepy.   Purchase guaifenesin extra strength (1200 mg) every 12 hours over-the-counter (the one without the DM). This will help to break up mucous in your nose and lungs.  If you develop any new or worsening symptoms or do not improve in the next 2 to 3 days, please return.  If your symptoms are severe, please go to the emergency room.  Follow-up with your primary care provider for further evaluation and management of your symptoms as well as ongoing wellness visits.  I hope you feel better!

## 2022-01-03 NOTE — ED Triage Notes (Signed)
Pt states cough, congestion and sore throat for the past 3 days.

## 2022-01-03 NOTE — ED Provider Notes (Signed)
Jocelyn Howard    CSN: [id number] Arrival date & time: 01/03/22  1844      History   Chief Complaint Chief Complaint  Patient presents with   Sore Throat    Continuase cough causing headaches, woke up choking twice from sleep due to sleeping with mouth open because of stuffed nose - Entered by patient   Cough    HPI Jocelyn Howard is a 21 y.o. female.   Patient presents urgent care for evaluation of cough, nasal congestion, sore throat, chills, and wheezing over the last 3 days.  Symptoms started 3 days ago on December 31, 2021.  Cough is productive with thick sputum and worse at nighttime.  Nasal congestion makes it difficult for her to breathe out of her nose.   No fever at home, experiencing chills.  Denies nausea, vomiting, abdominal pain, dizziness, ear pain, headache, diarrhea, difficulty swallowing, shortness of breath, chest pain, and weakness.She is a current every day cigarette smoker, denies all other drug use.  Denies history of chronic medical problems/respiratory problems.  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  She has been using over-the-counter medications for symptoms without relief.   Sore Throat  Cough   Past Medical History:  Diagnosis Date   History of COVID-19 2020   per pt mild symtpoms that resolved   Pelvic mass in female    Wears glasses     Patient Active Problem List   Diagnosis Date Noted   Pelvic mass in female 02/19/2021    Past Surgical History:  Procedure Laterality Date   LAPAROSCOPIC BILATERAL SALPINGO OOPHERECTOMY N/A 02/28/2021   Procedure: LAPAROSCOPIC RIGHT OOPHORECTOMY;  Surgeon: Osborne Oman, MD;  [facility];  Service: Gynecology;  Laterality: N/A;   NO PAST SURGERIES      OB History     Gravida  1   Para  0   Term  0   Preterm  0   AB  1   Living  0      SAB  0   IAB  1   Ectopic   0   Multiple  0   Live Births  0            Home Medications    Prior to Admission medications   Medication Sig Start Date End Date Taking? Authorizing Provider  albuterol (VENTOLIN HFA) 108 (90 Base) MCG/ACT inhaler Inhale 1-2 puffs into the lungs every 6 (six) hours as needed for wheezing or shortness of breath. 01/03/22  Yes Talbot Grumbling, FNP  promethazine-dextromethorphan (PROMETHAZINE-DM) 6.25-15 MG/5ML syrup Take 5 mLs by mouth at bedtime as needed for cough. 01/03/22  Yes Kadrian Partch, Stasia Cavalier, FNP    Family History Family History  Problem Relation Age of Onset   Healthy Mother    Diabetes Father     Social History Social History   Tobacco Use   Smoking status: Never   Smokeless tobacco: Never  Vaping Use   Vaping Use: Some days   Substances: Nicotine   Devices: unsure  Substance Use Topics   Alcohol use: Yes    Comment: occasional   Drug use: Yes    Types: Marijuana    Comment: occasional     Allergies   Patient has no known allergies.   Review of Systems Review of Systems  Respiratory:  Positive for cough.   Per  HPI   Physical Exam Triage Vital Signs ED Triage Vitals  Enc Vitals Group     BP 01/03/22 1856 (!) 159/91     Pulse Rate 01/03/22 1856 91     Resp 01/03/22 1856 16     Temp 01/03/22 1856 98.5 F (36.9 C)     Temp Source 01/03/22 1856 Oral     SpO2 01/03/22 1856 98 %     Weight --      Height --      Head Circumference --      Peak Flow --      Pain Score 01/03/22 1858 10     Pain Loc --      Pain Edu? --      Excl. in [place]? --    No data found.  Updated Vital Signs BP (!) 159/91 (BP Location: Right Arm)   Pulse 91   Temp 98.5 F (36.9 C) (Oral)   Resp 16   LMP 12/30/2021 (Approximate)   SpO2 98%   Visual Acuity Right Eye Distance:   Left Eye Distance:   Bilateral Distance:    Right Eye Near:   Left Eye Near:    Bilateral Near:     Physical Exam Vitals and nursing note reviewed.  Constitutional:       Appearance: She is not ill-appearing or toxic-appearing.  HENT:     Head: Normocephalic and atraumatic.     Right Ear: Hearing, tympanic membrane, ear canal and external ear normal.     Left Ear: Hearing, tympanic membrane, ear canal and external ear normal.     Nose: Congestion present.     Comments: May begin nasal turbinate swelling and congestion present.    Mouth/Throat:     Lips: Pink.     Mouth: Mucous membranes are moist.     Pharynx: Posterior oropharyngeal erythema present.     Comments: Small amount of clear postnasal drainage visualized to the posterior oropharynx.  Eyes:     General: Lids are normal. Vision grossly intact. Gaze aligned appropriately.     Extraocular Movements: Extraocular movements intact.     Conjunctiva/sclera: Conjunctivae normal.  Cardiovascular:     Rate and Rhythm: Normal rate and regular rhythm.     Heart sounds: Normal heart sounds, S1 normal and S2 normal.  Pulmonary:     Effort: Pulmonary effort is normal. No respiratory distress.     Breath sounds: Normal air entry. Wheezing present.     Comments: Faint expiratory wheeze heard to the left lower lung field with auscultation.  Otherwise, breath sounds normal throughout.  No respiratory distress. Musculoskeletal:     Cervical back: Neck supple.  Skin:    General: Skin is warm and dry.     Capillary Refill: Capillary refill takes less than 2 seconds.     Findings: No rash.  Neurological:     General: No focal deficit present.     Mental Status: She is alert and oriented to person, place, and time. Mental status is at baseline.     Cranial Nerves: No dysarthria or facial asymmetry.  Psychiatric:        Mood and Affect: Mood normal.        Speech: Speech normal.        Behavior: Behavior normal.        Thought Content: Thought content normal.        Judgment: Judgment normal.      UC Treatments / Results  Labs (all  labs ordered are listed, but only abnormal results are  displayed) Labs Reviewed - No data to display  EKG   Radiology No results found.  Procedures Procedures (including critical care time)  Medications Ordered in UC Medications  albuterol (VENTOLIN HFA) 108 (90 Base) MCG/ACT inhaler 2 puff (has no administration in time range)  dexamethasone (DECADRON) injection 10 mg (has no administration in time range)    Initial Impression / Assessment and Plan / UC Course  I have reviewed the triage vital signs and the nursing notes.  Pertinent labs & imaging results that were available during my care of the patient were reviewed by me and considered in my medical decision making (see chart for details).   1.  Acute bronchitis Presentation is consistent with acute bronchitis.  Deferred viral testing today as this will not change treatment plan, patient is agreeable with this.  Deferred imaging based on stable cardiopulmonary exam and hemodynamically stable vital signs.  May continue taking over-the-counter medications as needed for symptoms.  May use Tylenol/ibuprofen as needed for sore throat and chills.  Patient given dexamethasone IM 10 mg in clinic to help with inflammation and wheezing.  Also given albuterol 1 to 2 puffs in clinic, may use this at home every 4-6 hours as needed for cough, shortness of breath, and wheeze.  May purchase Mucinex over-the-counter and take this 1200 mg every 12 hours to help with nasal congestion and cough.  Humidifier to room recommended.  Work note given.   Discussed physical exam and available lab work findings in clinic with patient.  Counseled patient regarding appropriate use of medications and potential side effects for all medications recommended or prescribed today. Discussed red flag signs and symptoms of worsening condition,when to call the PCP office, return to urgent care, and when to seek higher level of care in the emergency department. Patient verbalizes understanding and agreement with plan. All questions  answered. Patient discharged in stable condition.    Final Clinical Impressions(s) / UC Diagnoses   Final diagnoses:  Acute bronchitis, unspecified organism  Sore throat  Nasal congestion  Wheezes  Acute cough     Discharge Instructions      You have bronchitis which is inflammation of the upper airways in your lungs.  We gave you a steroid shot in the clinic to help with your breathing and inflammation in your lungs.  Do not take any ibuprofen until tomorrow afternoon or evening due to the steroid shot.  The steroid injection will help you over the next 1 to 2 days to help reduce inflammation in your lungs.  You may use albuterol inhaler 1 to 2 puffs every 4-6 hours as needed for cough, shortness of breath, and wheezing.  You may use Promethazine DM cough syrup at bedtime as needed.  Do not use this during the day, if you go to work, or drink alcohol when using this as it can make you very sleepy.   Purchase guaifenesin extra strength (1200 mg) every 12 hours over-the-counter (the one without the DM). This will help to break up mucous in your nose and lungs.  If you develop any new or worsening symptoms or do not improve in the next 2 to 3 days, please return.  If your symptoms are severe, please go to the emergency room.  Follow-up with your primary care provider for further evaluation and management of your symptoms as well as ongoing wellness visits.  I hope you feel better!   ED Prescriptions  Medication Sig Dispense Auth. Provider   promethazine-dextromethorphan (PROMETHAZINE-DM) 6.25-15 MG/5ML syrup Take 5 mLs by mouth at bedtime as needed for cough. 118 mL Joella Prince M, FNP   albuterol (VENTOLIN HFA) 108 (90 Base) MCG/ACT inhaler Inhale 1-2 puffs into the lungs every 6 (six) hours as needed for wheezing or shortness of breath. 8 g Talbot Grumbling, FNP      PDMP not reviewed this encounter.   Talbot Grumbling, [place] 01/03/22 1933

## 2022-01-10 ENCOUNTER — Other Ambulatory Visit: Payer: Self-pay

## 2022-01-10 ENCOUNTER — Ambulatory Visit (INDEPENDENT_AMBULATORY_CARE_PROVIDER_SITE_OTHER): Payer: Medicaid Other

## 2022-01-10 ENCOUNTER — Other Ambulatory Visit (HOSPITAL_COMMUNITY)
Admission: RE | Admit: 2022-01-10 | Discharge: 2022-01-10 | Disposition: A | Discharge status: Home / Self Care | Payer: Medicaid Other | Source: Ambulatory Visit | Attending: Family Medicine | Admitting: Family Medicine

## 2022-01-10 VITALS — BP 136/78 | HR 76 | Ht 62.0 in | Wt 196.9 lb

## 2022-01-10 DIAGNOSIS — N898 Other specified noninflammatory disorders of vagina: Secondary | ICD-10-CM

## 2022-01-10 NOTE — Progress Notes (Signed)
Pt reports brownish discharge and now greenish discharge.  Pt also reports mild vaginal irritation.  Pt explained how to obtain self swab and that we will call with abnormal results.  Pt verbalized understanding with no further questions.   Jocelyn Howard  01/11/22

## 2022-01-11 ENCOUNTER — Other Ambulatory Visit: Payer: Self-pay | Admitting: Obstetrics and Gynecology

## 2022-01-11 DIAGNOSIS — B9689 Other specified bacterial agents as the cause of diseases classified elsewhere: Secondary | ICD-10-CM

## 2022-01-11 DIAGNOSIS — A599 Trichomoniasis, unspecified: Secondary | ICD-10-CM

## 2022-01-11 LAB — CERVICOVAGINAL ANCILLARY ONLY
Bacterial Vaginitis (gardnerella): POSITIVE — AB
Candida Glabrata: NEGATIVE
Candida Vaginitis: NEGATIVE
Chlamydia: NEGATIVE
Comment: NEGATIVE
Comment: NEGATIVE
Comment: NEGATIVE
Comment: NEGATIVE
Comment: NEGATIVE
Comment: NORMAL
Neisseria Gonorrhea: NEGATIVE
Trichomonas: POSITIVE — AB

## 2022-01-11 MED ORDER — METRONIDAZOLE 500 MG PO TABS: 500.0000 mg | ORAL_TABLET | Freq: Two times a day (BID) | ORAL | 0 refills | Status: AC

## 2022-01-28 DIAGNOSIS — Z419 Encounter for procedure for purposes other than remedying health state, unspecified: Secondary | ICD-10-CM | POA: Diagnosis not present

## 2022-02-28 DIAGNOSIS — Z419 Encounter for procedure for purposes other than remedying health state, unspecified: Secondary | ICD-10-CM | POA: Diagnosis not present

## 2022-03-29 DIAGNOSIS — Z419 Encounter for procedure for purposes other than remedying health state, unspecified: Secondary | ICD-10-CM | POA: Diagnosis not present

## 2022-04-01 ENCOUNTER — Ambulatory Visit (INDEPENDENT_AMBULATORY_CARE_PROVIDER_SITE_OTHER): Payer: Medicaid Other | Admitting: *Deleted

## 2022-04-01 ENCOUNTER — Other Ambulatory Visit: Payer: Self-pay

## 2022-04-01 ENCOUNTER — Other Ambulatory Visit (HOSPITAL_COMMUNITY)
Admission: RE | Admit: 2022-04-01 | Discharge: 2022-04-01 | Disposition: A | Discharge status: Home / Self Care | Payer: Medicaid Other | Source: Ambulatory Visit | Attending: Family Medicine | Admitting: Family Medicine

## 2022-04-01 VITALS — BP 130/73 | HR 70 | Ht 62.0 in | Wt 191.7 lb

## 2022-04-01 DIAGNOSIS — N898 Other specified noninflammatory disorders of vagina: Secondary | ICD-10-CM

## 2022-04-01 DIAGNOSIS — N949 Unspecified condition associated with female genital organs and menstrual cycle: Secondary | ICD-10-CM

## 2022-04-01 LAB — POCT URINALYSIS DIP (DEVICE)
Bilirubin Urine: NEGATIVE
Glucose, UA: NEGATIVE mg/dL
Hgb urine dipstick: NEGATIVE
Ketones, ur: NEGATIVE mg/dL
Leukocytes,Ua: NEGATIVE
Nitrite: NEGATIVE
Protein, ur: NEGATIVE mg/dL
Specific Gravity, Urine: >=1.03 (ref 1.005–1.030)
Urobilinogen, UA: 0.2 mg/dL (ref 0.0–1.0)
pH: 6.5 (ref 5.0–8.0)

## 2022-04-01 NOTE — Progress Notes (Signed)
Patient was assessed and managed by nursing staff during this encounter. I have reviewed the chart and agree with the documentation and plan. I have also made any necessary editorial changes.  Griffin Basil, MD 04/01/2022 5:12 PM

## 2022-04-01 NOTE — Progress Notes (Signed)
States when she made the appointment last week was notiicing burning in perineal area when she wiped after using restroom. States that lasted 3 days then resolved, none at present. Also noticied had some bleeding after intercourse. Denies pain or burning with urination then or now. Self swab completed. Also obtained ua. UA negative  Explained will be contacted if any positive results and needs treatment. She voices understanding. Staci Acosta

## 2022-04-02 LAB — CERVICOVAGINAL ANCILLARY ONLY
Bacterial Vaginitis (gardnerella): POSITIVE — AB
Candida Glabrata: NEGATIVE
Candida Vaginitis: NEGATIVE
Chlamydia: NEGATIVE
Comment: NEGATIVE
Comment: NEGATIVE
Comment: NEGATIVE
Comment: NEGATIVE
Comment: NEGATIVE
Comment: NORMAL
Neisseria Gonorrhea: NEGATIVE
Trichomonas: NEGATIVE

## 2022-04-03 ENCOUNTER — Telehealth: Payer: Self-pay | Admitting: *Deleted

## 2022-04-03 DIAGNOSIS — N760 Acute vaginitis: Secondary | ICD-10-CM

## 2022-04-03 NOTE — Telephone Encounter (Addendum)
-----   Message from Griffin Basil, MD sent at 04/02/2022  5:53 PM EST ----- Vaginal swab positive for BV, will offer treatment  3/6  0920  Called pt and she did not answer.  Message left for her to call us back for test result information.

## 2022-04-04 MED ORDER — METRONIDAZOLE 500 MG PO TABS: 500.0000 mg | ORAL_TABLET | Freq: Two times a day (BID) | ORAL | 0 refills | Status: DC

## 2022-04-04 NOTE — Telephone Encounter (Signed)
Patient called and left message stating she is returning our call.   Called patient, no answer- left message to check mychart account for results.

## 2022-04-29 DIAGNOSIS — Z419 Encounter for procedure for purposes other than remedying health state, unspecified: Secondary | ICD-10-CM | POA: Diagnosis not present

## 2022-05-21 ENCOUNTER — Encounter: Payer: Self-pay | Admitting: Obstetrics & Gynecology

## 2022-05-29 DIAGNOSIS — Z419 Encounter for procedure for purposes other than remedying health state, unspecified: Secondary | ICD-10-CM | POA: Diagnosis not present

## 2022-06-06 ENCOUNTER — Other Ambulatory Visit (HOSPITAL_COMMUNITY)
Admission: RE | Admit: 2022-06-06 | Discharge: 2022-06-06 | Disposition: A | Discharge status: Home / Self Care | Payer: Medicaid Other | Source: Ambulatory Visit | Attending: Obstetrics and Gynecology | Admitting: Obstetrics and Gynecology

## 2022-06-06 ENCOUNTER — Ambulatory Visit (INDEPENDENT_AMBULATORY_CARE_PROVIDER_SITE_OTHER): Payer: Medicaid Other | Admitting: Obstetrics and Gynecology

## 2022-06-06 ENCOUNTER — Encounter: Payer: Self-pay | Admitting: Obstetrics and Gynecology

## 2022-06-06 ENCOUNTER — Other Ambulatory Visit: Payer: Self-pay

## 2022-06-06 VITALS — BP 137/89 | HR 77 | Wt 191.2 lb

## 2022-06-06 DIAGNOSIS — B9689 Other specified bacterial agents as the cause of diseases classified elsewhere: Secondary | ICD-10-CM

## 2022-06-06 DIAGNOSIS — N939 Abnormal uterine and vaginal bleeding, unspecified: Secondary | ICD-10-CM

## 2022-06-06 DIAGNOSIS — N760 Acute vaginitis: Secondary | ICD-10-CM

## 2022-06-06 LAB — POCT PREGNANCY, URINE: Preg Test, Ur: NEGATIVE

## 2022-06-06 MED ORDER — NORETHINDRONE ACETATE 5 MG PO TABS: 5.0000 mg | ORAL_TABLET | Freq: Every day | ORAL | 0 refills | Status: DC

## 2022-06-06 NOTE — Progress Notes (Addendum)
   GYNECOLOGY PROGRESS NOTE  History:  22 y.o. G1P0010 presents to [facility] office today for problem gyn visit. She reports irregular period.  She denies h/a, dizziness, shortness of breath, n/v, or fever/chills.    Started period 4/12, 3 days late. Bled until 4/18. 19-22 no bleeding. Started bleeding again 22 and currently bleeding. Never happened before. Bleeding through pads and having clots. Does not use birth control and does not want birth control. Sexually active with no condoms. Last intercourse 4/22, unprotected sex prior to period on 4/12.Suprapubic pain.   The following portions of the patient's history were reviewed and updated as appropriate: allergies, current medications, past family history, past medical history, past social history, past surgical history and problem list. Last pap smear on 5/8//23 was normal.  Health Maintenance Due  Topic Date Due   DTaP/Tdap/Td (1 - Tdap) Never done     Review of Systems:  Pertinent items are noted in HPI.   Objective:  Physical Exam Blood pressure 137/89, pulse 77, weight 191 lb 3.2 oz (86.7 kg), last menstrual period 05/10/2022. VS reviewed, nursing note reviewed,  Constitutional: well developed, well nourished, no distress HEENT: normocephalic CV: normal rate Pulm/chest wall: normal effort Breast Exam: deferred Abdomen: soft Neuro: alert and oriented x 3 Skin: warm, dry Psych: affect normal Pelvic exam: deferred  Assessment & Plan:  There are no diagnoses linked to this encounter.  1. Abnormal vaginal bleeding Collecting swabs today. Discussed differentials with patient. UPT negative Does not want birth control. Will trial Aygestin and wait for swabs   - norethindrone (AYGESTIN) 5 MG tablet; Take 1 tablet (5 mg total) by mouth daily.  Dispense: 7 tablet; Refill: 0   Follow up if symptoms worsen or progress.  Albertine Grates, FNP

## 2022-06-07 LAB — CERVICOVAGINAL ANCILLARY ONLY
Bacterial Vaginitis (gardnerella): POSITIVE — AB
Candida Glabrata: NEGATIVE
Candida Vaginitis: NEGATIVE
Chlamydia: NEGATIVE
Comment: NEGATIVE
Comment: NEGATIVE
Comment: NEGATIVE
Comment: NEGATIVE
Comment: NEGATIVE
Comment: NORMAL
Neisseria Gonorrhea: NEGATIVE
Trichomonas: NEGATIVE

## 2022-06-07 MED ORDER — METRONIDAZOLE 500 MG PO TABS: 500.0000 mg | ORAL_TABLET | Freq: Two times a day (BID) | ORAL | 0 refills | Status: DC

## 2022-06-07 NOTE — Addendum Note (Signed)
Addended by: Sue Lush on: 06/07/2022 04:38 PM   Modules accepted: Orders

## 2022-06-29 DIAGNOSIS — Z419 Encounter for procedure for purposes other than remedying health state, unspecified: Secondary | ICD-10-CM | POA: Diagnosis not present

## 2022-07-23 ENCOUNTER — Other Ambulatory Visit: Payer: Self-pay

## 2022-07-23 ENCOUNTER — Other Ambulatory Visit (HOSPITAL_COMMUNITY)
Admission: RE | Admit: 2022-07-23 | Discharge: 2022-07-23 | Disposition: A | Discharge status: Home / Self Care | Payer: Medicaid Other | Source: Ambulatory Visit | Attending: Family Medicine | Admitting: Family Medicine

## 2022-07-23 ENCOUNTER — Ambulatory Visit (INDEPENDENT_AMBULATORY_CARE_PROVIDER_SITE_OTHER): Payer: Medicaid Other | Admitting: General Practice

## 2022-07-23 VITALS — BP 126/72 | HR 75 | Ht 62.0 in | Wt 193.0 lb

## 2022-07-23 DIAGNOSIS — Z113 Encounter for screening for infections with a predominantly sexual mode of transmission: Secondary | ICD-10-CM | POA: Insufficient documentation

## 2022-07-23 NOTE — Progress Notes (Signed)
Patient presents to office today requesting STD screening. Patient was instructed in self swab & specimen collected. Bloodwork also collected. Advised results will be back in 24-48 hours and available via mychart.   Chase Caller RN BSN 07/23/22

## 2022-07-24 LAB — HEPATITIS C ANTIBODY: Hep C Virus Ab: NONREACTIVE

## 2022-07-24 LAB — HIV ANTIBODY (ROUTINE TESTING W REFLEX): HIV Screen 4th Generation wRfx: NONREACTIVE

## 2022-07-24 LAB — RPR: RPR Ser Ql: NONREACTIVE

## 2022-07-24 LAB — HEPATITIS B SURFACE ANTIGEN: Hepatitis B Surface Ag: NEGATIVE

## 2022-07-25 ENCOUNTER — Encounter: Payer: Self-pay | Admitting: Obstetrics & Gynecology

## 2022-07-26 LAB — CERVICOVAGINAL ANCILLARY ONLY
Chlamydia: NEGATIVE
Chlamydia: NEGATIVE
Comment: NEGATIVE
Comment: NEGATIVE
Comment: NEGATIVE
Comment: NORMAL
Comment: NORMAL
Neisseria Gonorrhea: NEGATIVE
Neisseria Gonorrhea: NEGATIVE
Trichomonas: NEGATIVE

## 2022-07-29 DIAGNOSIS — Z419 Encounter for procedure for purposes other than remedying health state, unspecified: Secondary | ICD-10-CM | POA: Diagnosis not present

## 2022-08-05 ENCOUNTER — Ambulatory Visit (INDEPENDENT_AMBULATORY_CARE_PROVIDER_SITE_OTHER): Payer: Medicaid Other | Admitting: Obstetrics & Gynecology

## 2022-08-05 ENCOUNTER — Encounter: Payer: Self-pay | Admitting: Obstetrics & Gynecology

## 2022-08-05 ENCOUNTER — Other Ambulatory Visit: Payer: Self-pay

## 2022-08-05 VITALS — BP 130/79 | HR 73 | Wt 193.1 lb

## 2022-08-05 DIAGNOSIS — D6489 Other specified anemias: Secondary | ICD-10-CM | POA: Diagnosis not present

## 2022-08-05 DIAGNOSIS — N926 Irregular menstruation, unspecified: Secondary | ICD-10-CM

## 2022-08-05 NOTE — Progress Notes (Signed)
GYNECOLOGY OFFICE VISIT NOTE  History:   Jocelyn Howard is a 22 y.o. G1P0010 here today for continued evaluation of irregular menstrual periods.  Had normal menstrual cycles until April, had three episodes of bleeding that were prolonged, and then had two in May.  Normal cycle in June.  Currently on cycle now, on Day 8. This is prolonged for her. Had normal examination here on 06/06/22, and negative testing for infections (vaginitis and STI screen).  She was given 7 days of Aygestin, this did not help. She is hesitant to be on OCPs because she is worried about associated side effects. She denies any abnormal vaginal discharge, pelvic pain or other concerns.    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   Health Maintenance:  Normal pap on 06/04/2021.   Review of Systems:  Pertinent items noted in HPI and remainder of comprehensive ROS otherwise negative.  Physical Exam:  BP 130/79   Pulse 73   Wt 193 lb 1.6 oz (87.6 kg)   LMP 07/29/2022 (Exact Date)   BMI 35.32 kg/m  CONSTITUTIONAL: Well-developed, well-nourished female in no acute distress.  HEENT:  Normocephalic, atraumatic. External right and left ear normal. No scleral icterus.   NECK: Normal range of motion, supple, no masses noted on observation SKIN: No rash noted. Not diaphoretic. No erythema. No pallor. MUSCULOSKELETAL: Normal range of motion. No edema noted. NEUROLOGIC: Alert and oriented to person, place, and time. Normal muscle tone coordination. No cranial nerve deficit noted. PSYCHIATRIC: Normal mood and affect. Normal behavior. Normal judgment and thought content. CARDIOVASCULAR: Normal heart rate noted RESPIRATORY: Effort and breath sounds normal, no problems with respiration noted ABDOMEN: No masses noted. No other overt distention noted.   PELVIC: Deferred  Labs and Imaging Results for orders placed or performed in visit on 07/23/22 (from the past 840 hour(s))  Cervicovaginal ancillary only( [place])   Collection Time: 07/23/22  9:27 AM  Result Value Ref Range   Neisseria Gonorrhea Negative    Neisseria Gonorrhea Negative    Chlamydia Negative    Chlamydia Negative    Trichomonas Negative    Comment Normal Reference Range Trichomonas - Negative    Comment Normal Reference [place] Chlamydia - Negative    Comment      Normal Reference Range Neisseria Gonorrhea - Negative   Comment Normal Reference [place] Chlamydia - Negative    Comment      Normal Reference Range Neisseria Gonorrhea - Negative  RPR   Collection Time: 07/23/22  9:37 AM  Result Value Ref Range   RPR Ser Ql Non Reactive Non Reactive  HIV Antibody (routine testing w rflx)   Collection Time: 07/23/22  9:37 AM  Result Value Ref Range   HIV Screen 4th Generation wRfx Non Reactive Non Reactive  Hepatitis B Surface AntiGEN   Collection Time: 07/23/22  9:37 AM  Result Value Ref Range   Hepatitis B Surface Ag Negative Negative  Hepatitis C Antibody   Collection Time: 07/23/22  9:37 AM  Result Value Ref Range   Hep C Virus Ab Non Reactive Non Reactive      Assessment and Plan:     1. Irregular periods/menstrual cycles Patient has abnormal uterine bleeding . She has a normal  exam, no evidence of lesions.  Will order other abnormal uterine bleeding evaluation labs and pelvic ultrasound to evaluate for any structural gynecologic abnormalities. Discussed possible etiologies of this AUB with patient (PCOS, hormonal imbalance, structural lesions etc).  Will contact patient with these results and plans for further evaluation/management. - US PELVIC COMPLETE WITH TRANSVAGINAL; Future - CBC - TSH Rfx on Abnormal to Free T4 - Testosterone,Free and Total - Beta hCG quant (ref lab)  Routine preventative health maintenance measures emphasized. Please refer to After Visit Summary for other counseling recommendations.   Return for follow up as recommended.    I spent 30 minutes dedicated to the care of this patient including pre-visit review of records, face to face time with the patient discussing her conditions and treatments and post visit orders.    Jaynie Collins, MD, FACOG Obstetrician & Gynecologist, [facility] for [employer], [facility]

## 2022-08-07 LAB — CBC
Hematocrit: 32 % — ABNORMAL LOW (ref 34.0–46.6)
Hemoglobin: 9.6 g/dL — ABNORMAL LOW (ref 11.1–15.9)
MCH: 23.5 pg — ABNORMAL LOW (ref 26.6–33.0)
MCHC: 30 g/dL — ABNORMAL LOW (ref 31.5–35.7)
MCV: 78 fL — ABNORMAL LOW (ref 79–97)
Platelets: 344 10*3/uL (ref 150–450)
RBC: 4.08 x10E6/uL (ref 3.77–5.28)
RDW: 13.7 % (ref 11.7–15.4)
WBC: 7 10*3/uL (ref 3.4–10.8)

## 2022-08-07 LAB — TESTOSTERONE,FREE AND TOTAL
Testosterone, Free: 1.7 pg/mL (ref 0.0–4.2)
Testosterone: 30 ng/dL (ref 13–71)

## 2022-08-07 LAB — TSH RFX ON ABNORMAL TO FREE T4: TSH: 1.13 u[IU]/mL (ref 0.450–4.500)

## 2022-08-07 LAB — BETA HCG QUANT (REF LAB): hCG Quant: <1 m[IU]/mL

## 2022-08-07 MED ORDER — FERRIC MALTOL 30 MG PO CAPS
1.0000 | ORAL_CAPSULE | Freq: Two times a day (BID) | ORAL | 2 refills | Status: DC
Start: 2022-08-07 — End: 2023-01-14

## 2022-08-07 NOTE — Addendum Note (Signed)
Addended by: Jaynie Collins A on: 08/07/2022 02:56 PM   Modules accepted: Orders

## 2022-08-09 ENCOUNTER — Ambulatory Visit (HOSPITAL_COMMUNITY)
Admission: RE | Admit: 2022-08-09 | Discharge: 2022-08-09 | Disposition: A | Discharge status: Home / Self Care | Payer: Medicaid Other | Source: Ambulatory Visit | Attending: Obstetrics & Gynecology | Admitting: Obstetrics & Gynecology

## 2022-08-09 DIAGNOSIS — N926 Irregular menstruation, unspecified: Secondary | ICD-10-CM | POA: Diagnosis not present

## 2022-08-15 ENCOUNTER — Encounter (HOSPITAL_COMMUNITY): Payer: Self-pay | Admitting: Emergency Medicine

## 2022-08-15 ENCOUNTER — Emergency Department (HOSPITAL_COMMUNITY): Payer: Medicaid Other

## 2022-08-15 ENCOUNTER — Other Ambulatory Visit: Payer: Self-pay

## 2022-08-15 ENCOUNTER — Emergency Department (HOSPITAL_COMMUNITY)
Admission: EM | Admit: 2022-08-15 | Discharge: 2022-08-16 | Disposition: A | Discharge status: Home / Self Care | Payer: Medicaid Other | Attending: Emergency Medicine | Admitting: Emergency Medicine

## 2022-08-15 DIAGNOSIS — Z23 Encounter for immunization: Secondary | ICD-10-CM | POA: Insufficient documentation

## 2022-08-15 DIAGNOSIS — W540XXA Bitten by dog, initial encounter: Secondary | ICD-10-CM | POA: Diagnosis not present

## 2022-08-15 DIAGNOSIS — S61310A Laceration without foreign body of right index finger with damage to nail, initial encounter: Secondary | ICD-10-CM | POA: Diagnosis not present

## 2022-08-15 DIAGNOSIS — S62639B Displaced fracture of distal phalanx of unspecified finger, initial encounter for open fracture: Secondary | ICD-10-CM

## 2022-08-15 DIAGNOSIS — S62630B Displaced fracture of distal phalanx of right index finger, initial encounter for open fracture: Secondary | ICD-10-CM | POA: Insufficient documentation

## 2022-08-15 DIAGNOSIS — S62630A Displaced fracture of distal phalanx of right index finger, initial encounter for closed fracture: Secondary | ICD-10-CM | POA: Diagnosis not present

## 2022-08-15 DIAGNOSIS — S6991XA Unspecified injury of right wrist, hand and finger(s), initial encounter: Secondary | ICD-10-CM | POA: Diagnosis present

## 2022-08-15 DIAGNOSIS — S61319A Laceration without foreign body of unspecified finger with damage to nail, initial encounter: Secondary | ICD-10-CM

## 2022-08-15 MED ORDER — SODIUM CHLORIDE 0.9 % IV SOLN
3.0000 g | Freq: Once | INTRAVENOUS | Status: AC
  Administered 2022-08-16: 3 g via INTRAVENOUS
  Filled 2022-08-15: qty 8

## 2022-08-15 MED ORDER — LIDOCAINE HCL (PF) 1 % IJ SOLN
10.0000 mL | Freq: Once | INTRAMUSCULAR | Status: DC
  Filled 2022-08-15: qty 10

## 2022-08-15 NOTE — ED Triage Notes (Signed)
Pt c/o dog bite to right index finger, wound is wrapped and bleeding controlled at the time of triage. Dog is pt's and is UTD on shots.

## 2022-08-15 NOTE — ED Provider Notes (Signed)
[place] EMERGENCY DEPARTMENT AT [facility] Provider Note   CSN: [id number] Arrival date & time: 08/15/22  1845     History  Chief Complaint  Patient presents with   Animal Bite    Jocelyn Howard is a 22 y.o. female.  HPI Family pet dog bite to rt index finger. Giving treat and finger caught in dog's molar as biting down. Dog immunization UTD. Patient unsure of her Td status.    Home Medications Prior to Admission medications   Medication Sig Start Date End Date Taking? Authorizing Provider  amoxicillin-clavulanate (AUGMENTIN) 875-125 MG tablet Take 1 tablet by mouth every 12 (twelve) hours. 08/16/22  Yes Arby Barrette, MD  ibuprofen (ADVIL) 800 MG tablet Take 1 tablet (800 mg total) by mouth 3 (three) times daily. 08/16/22  Yes Arby Barrette, MD  Ferric Maltol 30 MG CAPS Take 1 capsule (30 mg total) by mouth 2 (two) times daily. Please take one hour before breakfast and dinner 08/07/22   Anyanwu, Jethro Bastos, MD      Allergies    Patient has no known allergies.    Review of Systems   Review of Systems  Physical Exam Updated Vital Signs BP 138/84   Pulse 91   Temp 99 F (37.2 C) (Oral)   Resp 17   Ht 5\' 2"  (1.575 m)   Wt 87.5 kg   LMP 07/29/2022 (Exact Date)   SpO2 99%   BMI 35.30 kg/m  Physical Exam Constitutional:      Appearance: Normal appearance.  HENT:     Mouth/Throat:     Pharynx: Oropharynx is clear.  Eyes:     Extraocular Movements: Extraocular movements intact.  Pulmonary:     Effort: Pulmonary effort is normal.  Musculoskeletal:     Right hand: Laceration present.       Hands:     Comments: Rt index laceration horizontally oriented through nail at mid point between base and tip of nail bed, extends beyond both eponychial folds, finger pad intact. Essentially amputated distal tip about 50% by compression but reasonably preserved anatomic alignment and soft tissue contact. Fingertip maintains aligned position.  Skin:     General: Skin is warm and dry.  Neurological:     General: No focal deficit present.     Mental Status: She is alert and oriented to person, place, and time.  Psychiatric:        Mood and Affect: Mood normal.     ED Results / Procedures / Treatments   Labs (all labs ordered are listed, but only abnormal results are displayed) Labs Reviewed - No data to display  EKG None  Radiology DG Finger Index Right  Result Date: 08/15/2022 CLINICAL DATA:  Dog bite. EXAM: RIGHT INDEX FINGER 2+V COMPARISON:  None Available. FINDINGS: Soft tissue defect to the distal aspect of the right second finger consistent with traumatic laceration. Transverse fracture of the distal phalangeal tuft of the right second finger with distraction of the fracture fragments. No additional fractures identified. Joint spaces are normal. IMPRESSION: 1. Soft tissue injury to the distal right second finger. 2. Transverse distracted fracture of the distal phalangeal tuft of the right second finger. Electronically Signed   By: Burman Nieves M.D.   On: 08/15/2022 23:39    Procedures Procedures   Wound irrigated with 1L NS. Xeroform dressing and gauze with metal splint applied. Patient tolerated well. Medications Ordered in ED Medications  Ampicillin-Sulbactam (UNASYN) 3 g in sodium chloride 0.9 % 100  mL IVPB (0 g Intravenous Stopped 08/16/22 0100)  Tdap (BOOSTRIX) injection 0.5 mL (0.5 mLs Intramuscular Given During Downtime 08/16/22 0100)  ibuprofen (ADVIL) tablet 800 mg (800 mg Oral Given During Downtime 08/16/22 0059)    ED Course/ Medical Decision Making/ A&P                             Medical Decision Making Amount and/or Complexity of Data Reviewed Radiology: ordered.  Risk Prescription drug management.   Dog bite with open tuft fracture.  Xrays reviewed by myself and reviewed Radiology interpretation.  Consult: Hand Dr.Chiaramonti. Recommends cleaning and dressing with splint. No suturing at this time.  Will assess in Office to decide operative vs in office Tx.  With open fracture from dog bite, patient given IV dose Unasyn and started on Augmentin. Tdap updated. Given Ibuprofen for pain.  Patient tolerated all treatment well and denied sig pain although significant fingertip wound with tuft fracture. At time of d/c, computer systems unexpectedly down and could not print d/c paperwork. Patient voiced understanding of plan and is able to get AVS from MyChart. She is  aware of need for antibiotics and f/u with hand specialist, Dr. Kerry Fort. Aware that if there are any issues getting her AVS online, she is to contact the ED to retrieve a printed copy first thing in the morning.       Final Clinical Impression(s) / ED Diagnoses Final diagnoses:  Dog bite, initial encounter  Laceration of finger nail bed, initial encounter  Open fracture of tuft of distal phalanx of finger    Rx / DC Orders ED Discharge Orders          Ordered    amoxicillin-clavulanate (AUGMENTIN) 875-125 MG tablet  Every 12 hours        08/16/22 0026    ibuprofen (ADVIL) 800 MG tablet  3 times daily        08/16/22 0026              Arby Barrette, MD 08/16/22 1201

## 2022-08-16 MED ORDER — IBUPROFEN 800 MG PO TABS
800.0000 mg | ORAL_TABLET | Freq: Once | ORAL | Status: AC
  Administered 2022-08-16: 800 mg via ORAL
  Filled 2022-08-16: qty 1

## 2022-08-16 MED ORDER — IBUPROFEN 800 MG PO TABS: 800.0000 mg | ORAL_TABLET | Freq: Three times a day (TID) | ORAL | 0 refills | Status: DC

## 2022-08-16 MED ORDER — AMOXICILLIN-POT CLAVULANATE 875-125 MG PO TABS: 1.0000 | ORAL_TABLET | Freq: Two times a day (BID) | ORAL | 0 refills | Status: DC

## 2022-08-16 MED ORDER — TETANUS-DIPHTH-ACELL PERTUSSIS 5-2.5-18.5 LF-MCG/0.5 IM SUSY
0.5000 mL | PREFILLED_SYRINGE | Freq: Once | INTRAMUSCULAR | Status: AC
  Administered 2022-08-16: 0.5 mL via INTRAMUSCULAR
  Filled 2022-08-16: qty 0.5

## 2022-08-16 NOTE — Discharge Instructions (Signed)
1.  You have a break in the bone of your fingertip.  This is due to a deep cut from a dog bite.  You need to follow-up with a hand specialist for repair of this injury.  Contact information for Dr.Chiaramonti is in your discharge instructions.  Your case was reviewed with him in the emergency department.  Call first thing tomorrow morning to be seen in the office within the next 1 to 2 days. 2.  You are given an IV dose of antibiotics in the emergency department.  Fill your prescription for Augmentin tomorrow morning and start taking it tomorrow morning. 3.  You may take ibuprofen as prescribed for pain every 8 hours.  Try to elevate your hand is much as possible to help with swelling. 4.  Your tetanus was updated in the emergency department today.

## 2022-08-17 ENCOUNTER — Ambulatory Visit
Admission: RE | Admit: 2022-08-17 | Discharge: 2022-08-17 | Disposition: A | Discharge status: Home / Self Care | Payer: Medicaid Other | Source: Ambulatory Visit | Attending: Urgent Care | Admitting: Urgent Care

## 2022-08-17 VITALS — BP 143/82 | HR 84 | Temp 98.6°F | Resp 16

## 2022-08-17 DIAGNOSIS — S62630G Displaced fracture of distal phalanx of right index finger, subsequent encounter for fracture with delayed healing: Secondary | ICD-10-CM

## 2022-08-17 NOTE — ED Provider Notes (Signed)
[facility]    CSN: [id number] Arrival date & time: 08/17/22  0272      History   Chief Complaint Chief Complaint  Patient presents with   Finger Injury    Just need bandage change, came in ER on the 18th due to dog bite witch broke my fingertip, won't see specialist till Tuesday, blood starting to come through - Entered by patient   Follow-up    HPI Jocelyn Howard is a 22 y.o. female.   Pleasant 22 year old female presents today due to concerns of bleeding occurring from her right index finger this morning.  She was seen at the emergency room on 08/15/22 after sustaining an injury to her distal fingertip by the molars of her pitbull.  She was trying to feed it a treat.  The dog caused an open fracture, patient was given pain medication, IV ampicillin, and discharged home with NSAIDs and Augmentin.  On the 18th, her finger was wrapped in Xeroform and placed in a splint.  Patient states she has not gone to fill the antibiotics yet, and has not changed the bandage.  She came to urgent care today because she was concerned her 26-day-old Xeroform had blood on it.  She denies exquisite pain to the fingertip, denies a fever, and denies any swelling or discharge.  She has a follow-up with a hand specialist in 3 days.     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   Past Surgical History:  Procedure Laterality Date   LAPAROSCOPIC BILATERAL SALPINGO OOPHERECTOMY N/A 02/28/2021   Procedure: LAPAROSCOPIC RIGHT OOPHORECTOMY;  Surgeon: Tereso Newcomer, MD;  Location: [facility];  Service: Gynecology;  Laterality: N/A;    OB History     Gravida  1    Para  0   Term  0   Preterm  0   AB  1   Living  0      SAB  0   IAB  1   Ectopic  0   Multiple  0   Live Births  0            Home Medications    Prior to Admission medications   Medication Sig Start Date End Date Taking? Authorizing Provider  amoxicillin-clavulanate (AUGMENTIN) 875-125 MG tablet Take 1 tablet by mouth every 12 (twelve) hours. 08/16/22   Arby Barrette, MD  Ferric Maltol 30 MG CAPS Take 1 capsule (30 mg total) by mouth 2 (two) times daily. Please take one hour before breakfast and dinner 08/07/22   Anyanwu, Jethro Bastos, MD  ibuprofen (ADVIL) 800 MG tablet Take 1 tablet (800 mg total) by mouth 3 (three) times daily. 08/16/22   Arby Barrette, MD    Family History Family History  Problem Relation Age of Onset   Healthy Mother    Diabetes Father     Social History Social History   Tobacco Use   Smoking status: Never   Smokeless tobacco: Never  Vaping Use   Vaping status: Some Days   Substances: Nicotine   Devices: unsure  Substance Use Topics   Alcohol use: Yes    Comment: occasional  Drug use: Yes    Types: Marijuana     Allergies   Patient has no known allergies.   Review of Systems Review of Systems As per HPI  Physical Exam Triage Vital Signs ED Triage Vitals  Encounter Vitals Group     BP 08/17/22 0917 (!) 143/82     Systolic BP Percentile --      Diastolic BP Percentile --      Pulse Rate 08/17/22 0917 84     Resp 08/17/22 0917 16     Temp 08/17/22 0917 98.6 F (37 C)     Temp Source 08/17/22 0917 Oral     SpO2 08/17/22 0917 98 %     Weight --      Height --      Head Circumference --      Peak Flow --      Pain Score 08/17/22 0916 0     Pain Loc --      Pain Education --      Exclude from Growth Chart --    No data found.  Updated Vital Signs BP (!) 143/82 (BP Location: Right Arm)   Pulse 84   Temp 98.6 F (37 C) (Oral)   Resp 16   LMP 07/29/2022 (Exact Date)   SpO2 98%   Visual Acuity Right Eye  Distance:   Left Eye Distance:   Bilateral Distance:    Right Eye Near:   Left Eye Near:    Bilateral Near:     Physical Exam Vitals and nursing note reviewed.  Constitutional:      General: She is not in acute distress.    Appearance: Normal appearance. She is normal weight. She is not ill-appearing, toxic-appearing or diaphoretic.  HENT:     Head: Normocephalic and atraumatic.  Cardiovascular:     Rate and Rhythm: Normal rate.  Pulmonary:     Effort: Pulmonary effort is normal. No respiratory distress.  Musculoskeletal:     Right hand: Deformity and laceration present. Normal range of motion. Normal sensation.       Hands:     Comments: FROM of finger No tenderness noted to DIP joint Macerated skin around lac  Skin:    General: Skin is warm.  Neurological:     Mental Status: She is alert.      UC Treatments / Results  Labs (all labs ordered are listed, but only abnormal results are displayed) Labs Reviewed - No data to display  EKG   Radiology DG Finger Index Right  Result Date: 08/15/2022 CLINICAL DATA:  Dog bite. EXAM: RIGHT INDEX FINGER 2+V COMPARISON:  None Available. FINDINGS: Soft tissue defect to the distal aspect of the right second finger consistent with traumatic laceration. Transverse fracture of the distal phalangeal tuft of the right second finger with distraction of the fracture fragments. No additional fractures identified. Joint spaces are normal. IMPRESSION: 1. Soft tissue injury to the distal right second finger. 2. Transverse distracted fracture of the distal phalangeal tuft of the right second finger. Electronically Signed   By: Burman Nieves M.D.   On: 08/15/2022 23:39    Procedures Wound Care  Date/Time: 08/17/2022 9:35 AM  Performed by: Maretta Bees, PA Authorized by: Maretta Bees, PA   Consent:    Consent obtained:  Verbal   Consent given by:  Patient   Risks, benefits, and alternatives were discussed: yes     Risks  discussed:  Bleeding, incomplete drainage, infection, nerve damage, poor  cosmetic result and pain   Alternatives discussed:  No treatment Universal protocol:    Procedure explained and questions answered to patient or proxy's satisfaction: yes     Patient identity confirmed:  Verbally with patient Procedure details:    Indications: open wounds     Wound location:  Finger   Finger location:  R index finger   Wound age (days):  4 Skin layer closed with:    Wound care performed:  Nothing Dressing:    Dressing applied:  Xeroform 1x8 and Telfa pad Post-procedure details:    Procedure completion:  Tolerated  (including critical care time)  Medications Ordered in UC Medications - No data to display  Initial Impression / Assessment and Plan / UC Course  I have reviewed the triage vital signs and the nursing notes.  Pertinent labs & imaging results that were available during my care of the patient were reviewed by me and considered in my medical decision making (see chart for details).     Open fx of distal phalanx R index finger - pt never filled her augmentin or NSAID. I strongly encouraged her to fill today and start taking as prescribed to prevent soft tissue infection. Additionally, pt has not changed the bandage. No active signs of infection, but maceration noted. We changed her dressing today and gave her extra Xeroform.  She was instructed to change it every 12 hours.  Her finger splint was placed back on for protection and prevention of excessive range of motion.  She does have a follow-up with a hand specialist in the next 3 days we encouraged her to keep this.   Final Clinical Impressions(s) / UC Diagnoses   Final diagnoses:  Open displaced fracture of distal phalanx of right index finger with delayed healing, subsequent encounter     Discharge Instructions      Please keep your appointment with the specialist for this coming Tuesday. It is imperative that you start the  Augmentin immediately.  Take this twice daily with food until gone. We rewrapped your finger with Xeroform gauze. Please change this gauze every 12 hours. We gave you an extra 1 to change this evening.  Please purchase extra Xeroform from [employer] or [employer]. If you develop any redness of the finger or swelling of the finger, please return for recheck.   ED Prescriptions   None    PDMP not reviewed this encounter.   Maretta Bees, [place] 08/17/22 1539

## 2022-08-17 NOTE — ED Triage Notes (Signed)
Pt states she is here for dsg change-dog bite injury to right index finger 7/15-dsg and finger splint in place upon arrival-NAD-steady gait

## 2022-08-17 NOTE — Discharge Instructions (Addendum)
Please keep your appointment with the specialist for this coming Tuesday. It is imperative that you start the Augmentin immediately.  Take this twice daily with food until gone. We rewrapped your finger with Xeroform gauze. Please change this gauze every 12 hours. We gave you an extra 1 to change this evening.  Please purchase extra Xeroform from [employer] or [employer]. If you develop any redness of the finger or swelling of the finger, please return for recheck.

## 2022-08-20 DIAGNOSIS — S62639B Displaced fracture of distal phalanx of unspecified finger, initial encounter for open fracture: Secondary | ICD-10-CM | POA: Diagnosis not present

## 2022-08-28 DIAGNOSIS — S62639B Displaced fracture of distal phalanx of unspecified finger, initial encounter for open fracture: Secondary | ICD-10-CM | POA: Diagnosis not present

## 2022-08-29 DIAGNOSIS — Z419 Encounter for procedure for purposes other than remedying health state, unspecified: Secondary | ICD-10-CM | POA: Diagnosis not present

## 2022-09-09 DIAGNOSIS — S62639B Displaced fracture of distal phalanx of unspecified finger, initial encounter for open fracture: Secondary | ICD-10-CM | POA: Diagnosis not present

## 2022-09-09 DIAGNOSIS — S62630B Displaced fracture of distal phalanx of right index finger, initial encounter for open fracture: Secondary | ICD-10-CM | POA: Diagnosis not present

## 2022-09-29 DIAGNOSIS — Z419 Encounter for procedure for purposes other than remedying health state, unspecified: Secondary | ICD-10-CM | POA: Diagnosis not present

## 2022-10-10 ENCOUNTER — Ambulatory Visit (INDEPENDENT_AMBULATORY_CARE_PROVIDER_SITE_OTHER): Payer: Medicaid Other | Admitting: *Deleted

## 2022-10-10 ENCOUNTER — Ambulatory Visit (HOSPITAL_COMMUNITY): Payer: Medicaid Other

## 2022-10-10 ENCOUNTER — Other Ambulatory Visit (HOSPITAL_COMMUNITY)
Admission: RE | Admit: 2022-10-10 | Discharge: 2022-10-10 | Disposition: A | Discharge status: Home / Self Care | Payer: Medicaid Other | Source: Ambulatory Visit | Attending: Family Medicine | Admitting: Family Medicine

## 2022-10-10 ENCOUNTER — Other Ambulatory Visit: Payer: Self-pay

## 2022-10-10 VITALS — BP 123/66 | HR 68 | Ht 62.0 in | Wt 189.5 lb

## 2022-10-10 DIAGNOSIS — Z202 Contact with and (suspected) exposure to infections with a predominantly sexual mode of transmission: Secondary | ICD-10-CM | POA: Diagnosis not present

## 2022-10-10 DIAGNOSIS — N898 Other specified noninflammatory disorders of vagina: Secondary | ICD-10-CM | POA: Diagnosis not present

## 2022-10-10 NOTE — Progress Notes (Signed)
Here for self swab for c/o white vaginal discharge and vaginal itching for about 6 days. Would like full wet prep. Self swab obtained and informed if any result positive that she will be contacted with treatment. She voices understanding. Nancy Fetter

## 2022-10-14 LAB — CERVICOVAGINAL ANCILLARY ONLY
Bacterial Vaginitis (gardnerella): POSITIVE — AB
Candida Glabrata: NEGATIVE
Candida Vaginitis: NEGATIVE
Chlamydia: NEGATIVE
Comment: NEGATIVE
Comment: NEGATIVE
Comment: NEGATIVE
Comment: NEGATIVE
Comment: NEGATIVE
Comment: NORMAL
Neisseria Gonorrhea: NEGATIVE
Trichomonas: NEGATIVE

## 2022-10-14 MED ORDER — METRONIDAZOLE 500 MG PO TABS: 500.0000 mg | ORAL_TABLET | Freq: Two times a day (BID) | ORAL | 0 refills | Status: DC

## 2022-10-14 NOTE — Addendum Note (Signed)
Addended by: Reva Bores on: 10/14/2022 11:46 AM   Modules accepted: Orders

## 2022-10-29 DIAGNOSIS — Z419 Encounter for procedure for purposes other than remedying health state, unspecified: Secondary | ICD-10-CM | POA: Diagnosis not present

## 2022-11-29 DIAGNOSIS — Z419 Encounter for procedure for purposes other than remedying health state, unspecified: Secondary | ICD-10-CM | POA: Diagnosis not present

## 2022-12-29 DIAGNOSIS — Z419 Encounter for procedure for purposes other than remedying health state, unspecified: Secondary | ICD-10-CM | POA: Diagnosis not present

## 2023-01-13 ENCOUNTER — Telehealth: Payer: Self-pay | Admitting: Family Medicine

## 2023-01-13 NOTE — Telephone Encounter (Signed)
I reached out to the patient regarding scheduling a self swab, informed patient we do not have any appointment until next week Thursday 01/23/23.

## 2023-01-14 ENCOUNTER — Ambulatory Visit (HOSPITAL_COMMUNITY)
Admission: RE | Admit: 2023-01-14 | Discharge: 2023-01-14 | Disposition: A | Discharge status: Home / Self Care | Payer: Medicaid Other | Source: Ambulatory Visit | Attending: Family Medicine | Admitting: Family Medicine

## 2023-01-14 ENCOUNTER — Encounter (HOSPITAL_COMMUNITY): Payer: Self-pay

## 2023-01-14 VITALS — BP 118/68 | HR 78 | Temp 98.8°F | Resp 16 | Ht 62.0 in | Wt 194.0 lb

## 2023-01-14 DIAGNOSIS — N760 Acute vaginitis: Secondary | ICD-10-CM | POA: Insufficient documentation

## 2023-01-14 DIAGNOSIS — Z202 Contact with and (suspected) exposure to infections with a predominantly sexual mode of transmission: Secondary | ICD-10-CM | POA: Diagnosis not present

## 2023-01-14 LAB — HIV ANTIBODY (ROUTINE TESTING W REFLEX): HIV Screen 4th Generation wRfx: NONREACTIVE

## 2023-01-14 MED ORDER — METRONIDAZOLE 500 MG PO TABS: 500.0000 mg | ORAL_TABLET | Freq: Two times a day (BID) | ORAL | 0 refills | Status: AC

## 2023-01-14 NOTE — ED Triage Notes (Signed)
Patient here today with vaginal odor and itching since Friday. Patient states that she recently changed partners and would like to be tested for all STDs.

## 2023-01-14 NOTE — ED Provider Notes (Signed)
[facility]    CSN: [id number] Arrival date & time: 01/14/23  1045      History   Chief Complaint Chief Complaint  Patient presents with   SEXUALLY TRANSMITTED DISEASE    I would like to take a self swab examine - Entered by patient    HPI Jocelyn Howard is a 22 y.o. female.   HPI Here for vaginal discharge and irritation smell.  No abdominal pain or fever.   last menstrual cycle was December 8.    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   Past Surgical History:  Procedure Laterality Date   LAPAROSCOPIC BILATERAL SALPINGO OOPHERECTOMY N/A 02/28/2021   Procedure: LAPAROSCOPIC RIGHT OOPHORECTOMY;  Surgeon: Tereso Newcomer, MD;  Location: [facility];  Service: Gynecology;  Laterality: N/A;    OB History     Gravida  1   Para  0   Term  0   Preterm  0   AB  1   Living  0      SAB  0   IAB  1   Ectopic  0   Multiple  0   Live Births  0            Home Medications    Prior to Admission medications   Medication Sig Start Date End Date Taking? Authorizing Provider  metroNIDAZOLE (FLAGYL) 500 MG tablet Take 1 tablet (500 mg total) by mouth 2 (two) times daily for 7 days. 01/14/23 01/21/23 Yes Noora Locascio, Janace Aris, MD    Family History Family History  Problem Relation Age of Onset   Healthy Mother    Diabetes Father     Social History Social History   Tobacco Use   Smoking status: Never   Smokeless tobacco: Never  Vaping Use   Vaping status: Some Days   Substances: Nicotine   Devices: unsure  Substance Use Topics   Alcohol use: Yes    Comment: occasional   Drug use: Yes     Types: Marijuana     Allergies   Patient has no known allergies.   Review of Systems Review of Systems   Physical Exam Triage Vital Signs ED Triage Vitals  Encounter Vitals Group     BP 01/14/23 1112 118/68     Systolic BP Percentile --      Diastolic BP Percentile --      Pulse Rate 01/14/23 1112 78     Resp 01/14/23 1112 16     Temp 01/14/23 1112 98.8 F (37.1 C)     Temp Source 01/14/23 1112 Oral     SpO2 01/14/23 1112 98 %     Weight 01/14/23 1112 194 lb (88 kg)     Height 01/14/23 1112 5\' 2"  (1.575 m)     Head Circumference --      Peak Flow --      Pain Score 01/14/23 1108 0     Pain Loc --      Pain Education --      Exclude from Growth Chart --    No data found.  Updated Vital Signs BP 118/68 (  BP Location: Right Arm)   Pulse 78   Temp 98.8 F (37.1 C) (Oral)   Resp 16   Ht 5\' 2"  (1.575 m)   Wt 88 kg   LMP 01/05/2023 (Exact Date)   SpO2 98%   BMI 35.48 kg/m   Visual Acuity Right Eye Distance:   Left Eye Distance:   Bilateral Distance:    Right Eye Near:   Left Eye Near:    Bilateral Near:     Physical Exam Vitals reviewed.  Constitutional:      General: She is not in acute distress.    Appearance: She is not toxic-appearing.  Skin:    Coloration: Skin is not pale.  Neurological:     Mental Status: She is alert and oriented to person, place, and time.  Psychiatric:        Behavior: Behavior normal.      UC Treatments / Results  Labs (all labs ordered are listed, but only abnormal results are displayed) Labs Reviewed  HIV ANTIBODY (ROUTINE TESTING W REFLEX)  RPR  CERVICOVAGINAL ANCILLARY ONLY    EKG   Radiology No results found.  Procedures Procedures (including critical care time)  Medications Ordered in UC Medications - No data to display  Initial Impression / Assessment and Plan / UC Course  I have reviewed the triage vital signs and the nursing notes.  Pertinent labs & imaging results that were available during  my care of the patient were reviewed by me and considered in my medical decision making (see chart for details).     Blood is drawn to check HIV and RPR. Vaginal self swab is done, and we will notify of any positives on that or on the blood work, and treat per protocol.  Flagyl sent for empiric treatment of possible BV, as she has had that before.  Final Clinical Impressions(s) / UC Diagnoses   Final diagnoses:  Acute vaginitis  Exposure to STD     Discharge Instructions      Staff will notify you if there is anything positive on the swab or on the bloodwork.  Take metronidazole 500 mg--1 tablet 2 times daily for 7 days.  Avoid drinking alcohol within 72 hours of taking this medication.  This is for potential BV.      ED Prescriptions     Medication Sig Dispense Auth. Provider   metroNIDAZOLE (FLAGYL) 500 MG tablet Take 1 tablet (500 mg total) by mouth 2 (two) times daily for 7 days. 14 tablet Leafy Motsinger, Janace Aris, MD      PDMP not reviewed this encounter.   Zenia Resides, MD 01/14/23 [phone]

## 2023-01-14 NOTE — Discharge Instructions (Addendum)
Staff will notify you if there is anything positive on the swab or on the bloodwork.  Take metronidazole 500 mg--1 tablet 2 times daily for 7 days.  Avoid drinking alcohol within 72 hours of taking this medication.  This is for potential BV.

## 2023-01-15 LAB — CERVICOVAGINAL ANCILLARY ONLY
Bacterial Vaginitis (gardnerella): POSITIVE — AB
Candida Glabrata: NEGATIVE
Candida Vaginitis: NEGATIVE
Chlamydia: NEGATIVE
Comment: NEGATIVE
Comment: NEGATIVE
Comment: NEGATIVE
Comment: NEGATIVE
Comment: NEGATIVE
Comment: NORMAL
Neisseria Gonorrhea: NEGATIVE
Trichomonas: NEGATIVE

## 2023-01-15 LAB — RPR: RPR Ser Ql: NONREACTIVE

## 2023-01-29 DIAGNOSIS — Z419 Encounter for procedure for purposes other than remedying health state, unspecified: Secondary | ICD-10-CM | POA: Diagnosis not present

## 2023-02-20 IMAGING — CT CT ABD-PELV W/ CM
2 of 4 series · 16 of 46 positions shown, 18 images · IV contrast (omnipaque)
Comparison: None.

CLINICAL DATA: Palpable tender abdominal mass for 1 week.

EXAM:
CT ABDOMEN AND PELVIS WITH CONTRAST
TECHNIQUE: Multidetector CT imaging of the abdomen and pelvis was performed
using the standard protocol following bolus administration of
intravenous contrast.
CONTRAST:  100mL OMNIPAQUE IOHEXOL 300 MG/ML  SOLN

[Series 3: abd/ pelvis 5.0 i30f 2 · axial · 0.95mm/px · z∈[+739,+1179]mm · 13 of 98 slices shown, 15 images]
[im 5/98  soft-tissue]
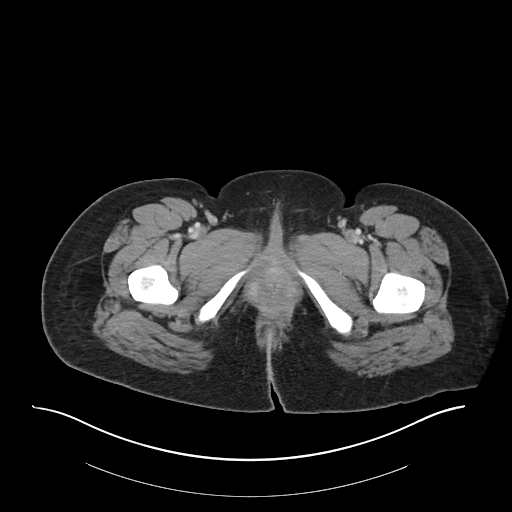
[im 5/98  bone]
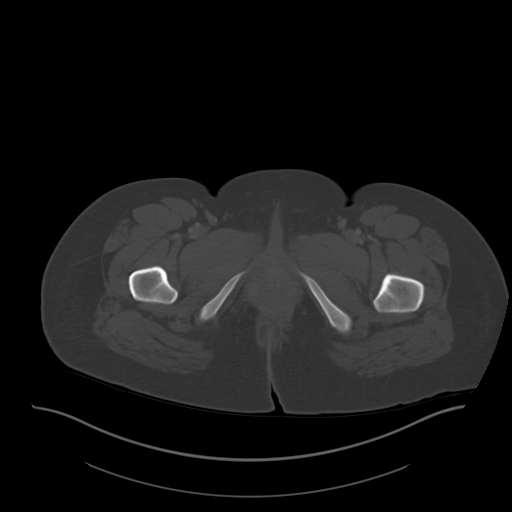
[im 13/98  soft-tissue]
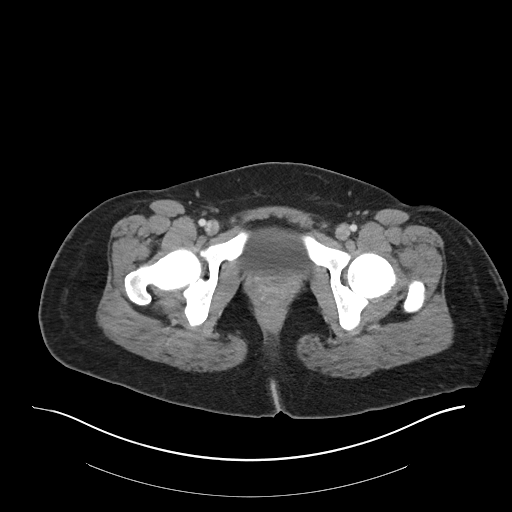
[im 21/98  soft-tissue]
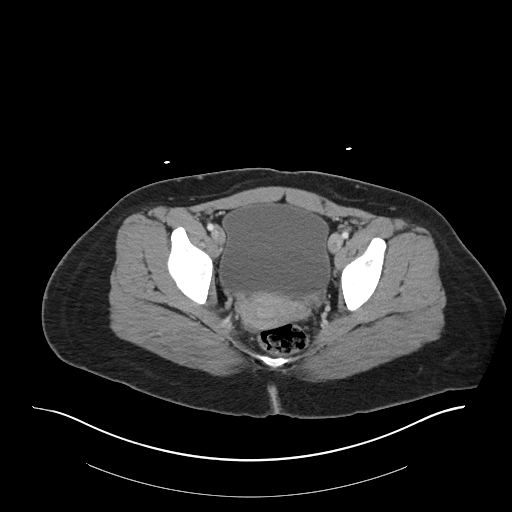
[im 29/98  soft-tissue]
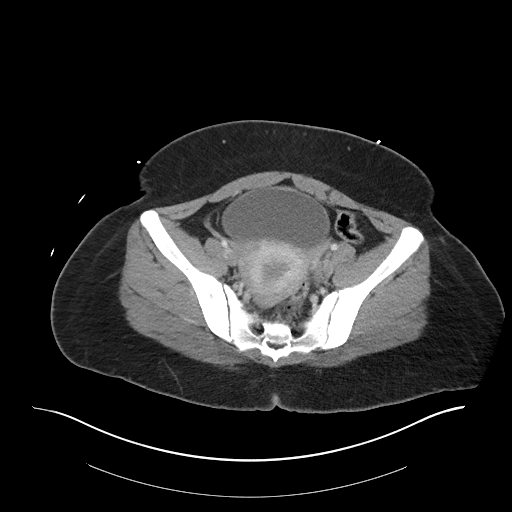
[im 33/98  soft-tissue]
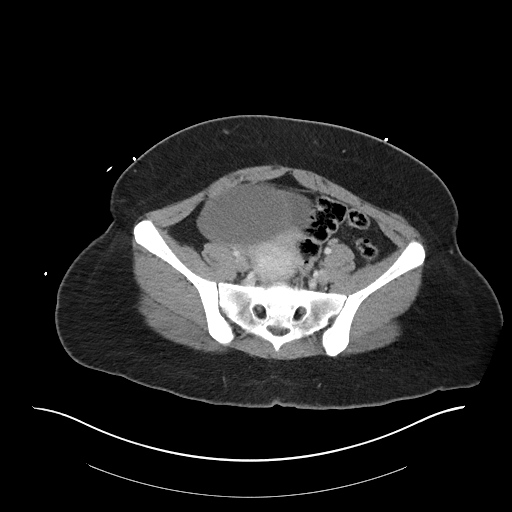
[im 41/98  soft-tissue]
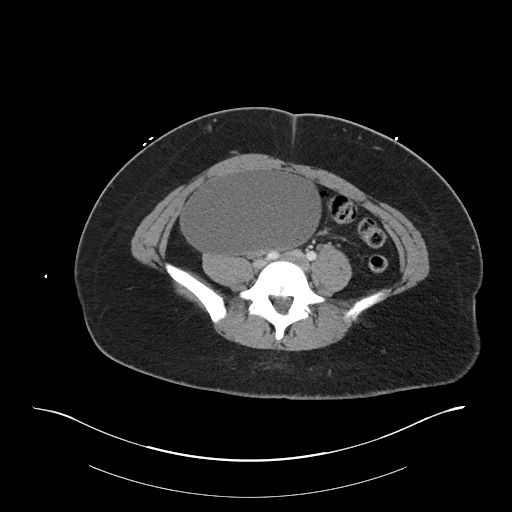
[im 49/98  soft-tissue]
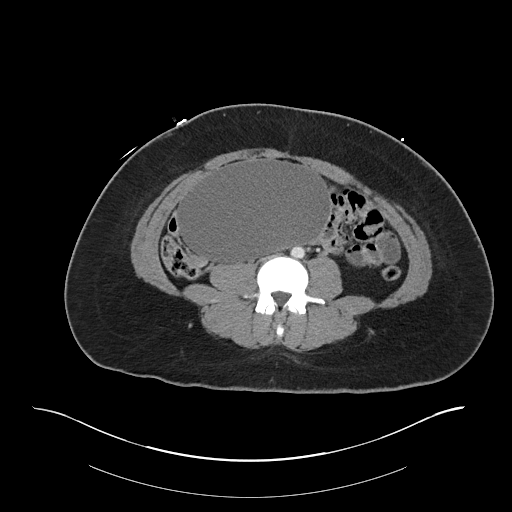
[im 57/98  soft-tissue]
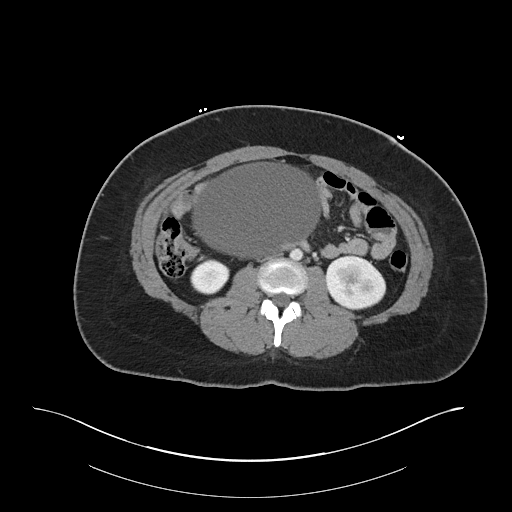
[im 65/98  soft-tissue]
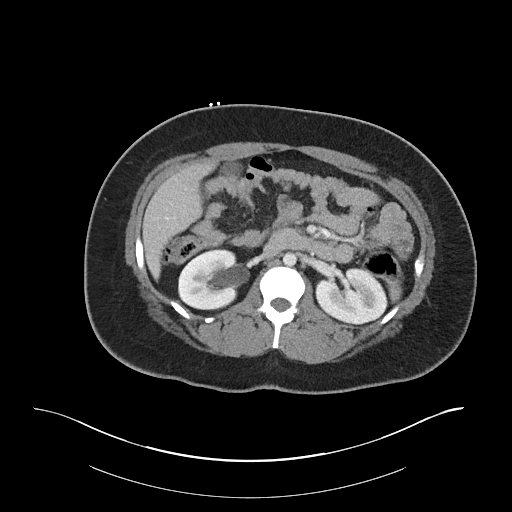
[im 65/98  bone]
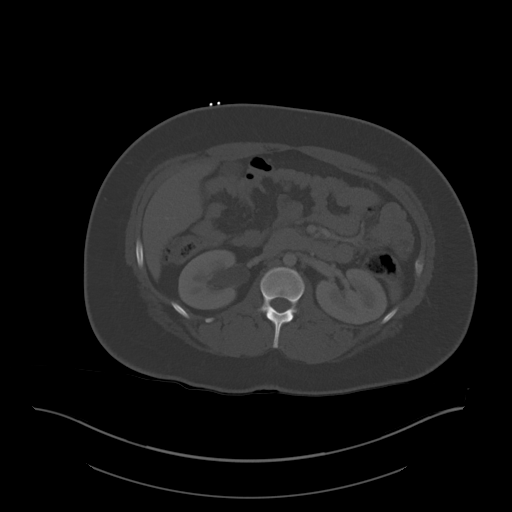
[im 69/98  soft-tissue]
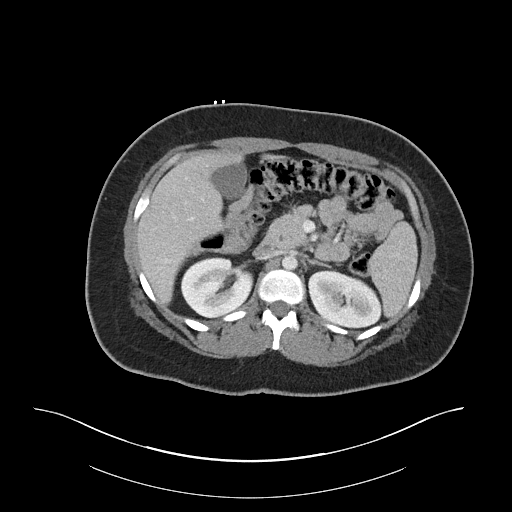
[im 77/98  soft-tissue]
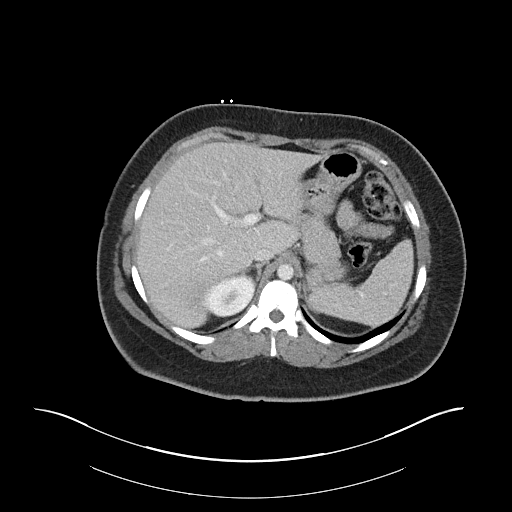
[im 85/98  soft-tissue]
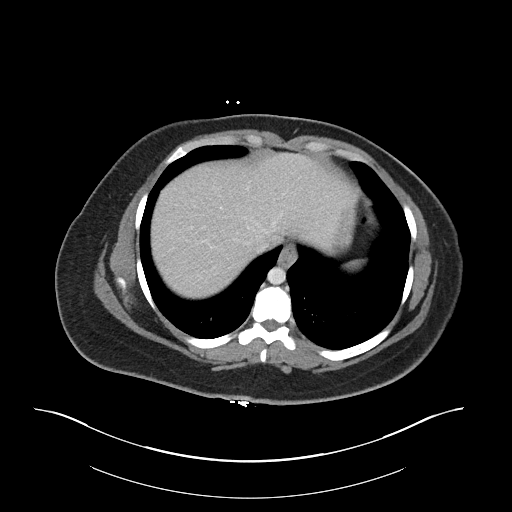
[im 93/98  soft-tissue]
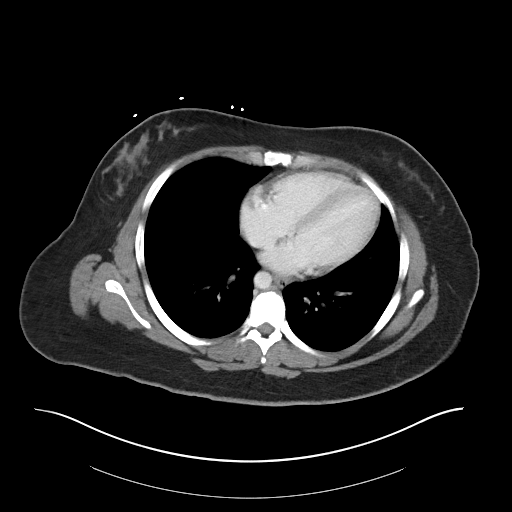

[Series 6: coronal soft tissue · coronal · 0.80mm/px · 3 of 97 slices shown]
[im 33/97  soft-tissue]
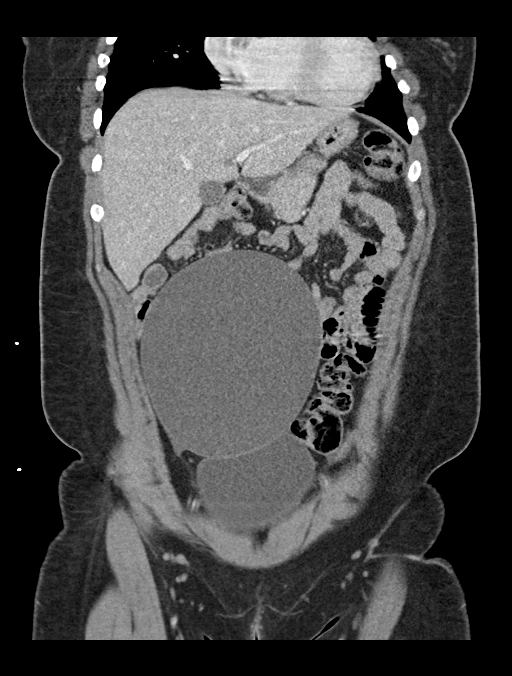
[im 43/97  soft-tissue]
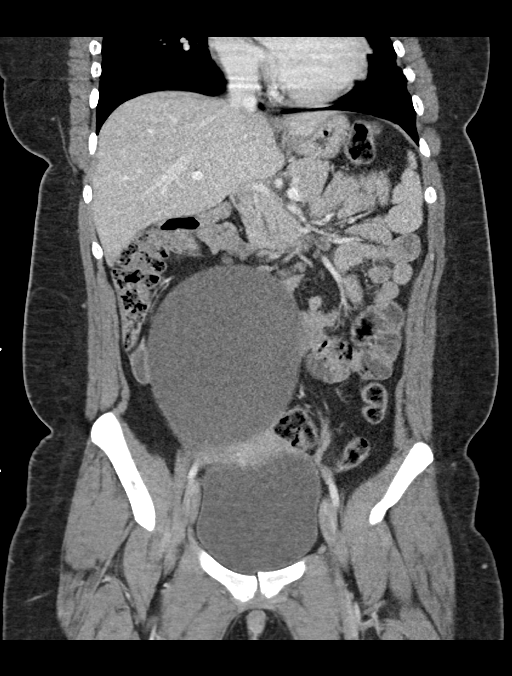
[im 54/97  soft-tissue]
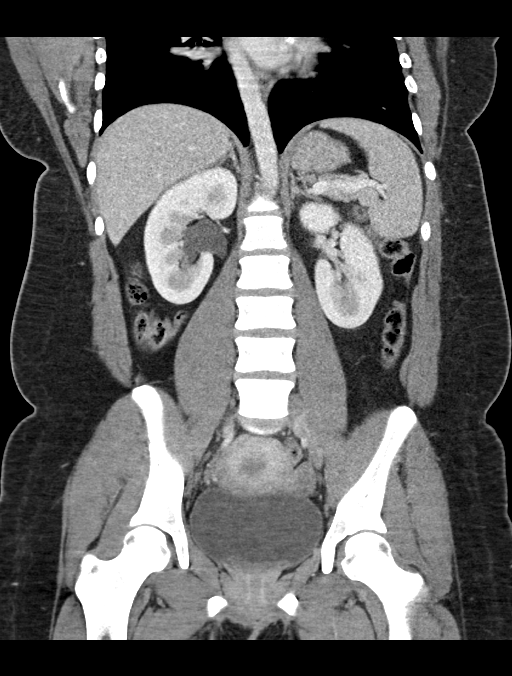

[16 of 46 positions shown; findings below may reference images not displayed]

FINDINGS: Lower Chest: No acute findings.

Hepatobiliary: No hepatic masses identified. Gallbladder is
unremarkable. No evidence of biliary ductal dilatation.

Pancreas:  No mass or inflammatory changes.

Spleen: Within normal limits in size and appearance.

Adrenals/Urinary Tract: No masses identified. No evidence of
ureteral calculi. Mild right hydroureteronephrosis is seen due to
extrinsic compression of the pelvic portion of the ureter by the
cystic adnexal mass described below.

Stomach/Bowel: No evidence of obstruction, inflammatory process or
abnormal fluid collections.

Vascular/Lymphatic: No pathologically enlarged lymph nodes. No acute
vascular findings.

Reproductive: Normal appearance of uterus. A large cystic lesion is
seen in the right lower abdomen and pelvis, which appears to arise
from the right adnexa. This measures 16.6 x 14.5 cm. A small mural
nodule is seen along the posterior wall measuring approximately 14
mm. No other complex features are seen. This is highly suspicious
for a cystic ovarian neoplasm, likely a serous cystadenoma. No other
pelvic masses identified. No evidence of free fluid.

Other:  None.

Musculoskeletal:  No suspicious bone lesions identified.
IMPRESSION: 16.6 cm cystic lesion in right lower abdomen and pelvis with small
mural soft tissue nodule. This is highly suspicious for a right
ovarian cystic neoplasm, likely a serous cystadenoma or
cystadenofibroma. GYN consultation recommended.

Mild right hydroureteronephrosis due to extrinsic compression by the
cystic adnexal mass.

## 2023-03-01 DIAGNOSIS — Z419 Encounter for procedure for purposes other than remedying health state, unspecified: Secondary | ICD-10-CM | POA: Diagnosis not present

## 2023-03-26 ENCOUNTER — Ambulatory Visit: Payer: Managed Care, Other (non HMO) | Admitting: *Deleted

## 2023-03-26 ENCOUNTER — Other Ambulatory Visit: Payer: Self-pay

## 2023-03-26 ENCOUNTER — Other Ambulatory Visit (HOSPITAL_COMMUNITY)
Admission: RE | Admit: 2023-03-26 | Discharge: 2023-03-26 | Disposition: A | Discharge status: Home / Self Care | Payer: Managed Care, Other (non HMO) | Source: Ambulatory Visit | Attending: Family Medicine | Admitting: Family Medicine

## 2023-03-26 VITALS — BP 122/54 | HR 75 | Ht 62.0 in | Wt 195.7 lb

## 2023-03-26 DIAGNOSIS — Z9189 Other specified personal risk factors, not elsewhere classified: Secondary | ICD-10-CM | POA: Diagnosis not present

## 2023-03-26 DIAGNOSIS — N898 Other specified noninflammatory disorders of vagina: Secondary | ICD-10-CM

## 2023-03-26 LAB — CERVICOVAGINAL ANCILLARY ONLY
Bacterial Vaginitis (gardnerella): POSITIVE — AB
Candida Glabrata: NEGATIVE
Candida Vaginitis: NEGATIVE
Chlamydia: NEGATIVE
Comment: NEGATIVE
Comment: NEGATIVE
Comment: NEGATIVE
Comment: NEGATIVE
Comment: NEGATIVE
Comment: NORMAL
Neisseria Gonorrhea: NEGATIVE
Trichomonas: NEGATIVE

## 2023-03-26 NOTE — Progress Notes (Signed)
 Pt presents with c/o recent vaginal spotting after unprotected intercourse and white discharge x1 week. Self swab obtained for wet prep and STI. Pt was advised that she will be notified of test results and if any treatment is indicated via Mychart.  She voiced understanding.

## 2023-03-28 ENCOUNTER — Other Ambulatory Visit: Payer: Self-pay | Admitting: Certified Nurse Midwife

## 2023-03-28 ENCOUNTER — Encounter: Payer: Self-pay | Admitting: Certified Nurse Midwife

## 2023-03-28 DIAGNOSIS — B9689 Other specified bacterial agents as the cause of diseases classified elsewhere: Secondary | ICD-10-CM

## 2023-03-28 MED ORDER — METRONIDAZOLE 0.75 % VA GEL
1.0000 | Freq: Every day | VAGINAL | 0 refills | Status: AC
Start: 2023-03-28 — End: 2023-04-04

## 2023-03-29 DIAGNOSIS — Z419 Encounter for procedure for purposes other than remedying health state, unspecified: Secondary | ICD-10-CM | POA: Diagnosis not present

## 2023-04-24 ENCOUNTER — Ambulatory Visit
Admission: RE | Admit: 2023-04-24 | Discharge: 2023-04-24 | Disposition: A | Discharge status: Home / Self Care | Source: Ambulatory Visit | Attending: Emergency Medicine | Admitting: Emergency Medicine

## 2023-04-24 ENCOUNTER — Ambulatory Visit (INDEPENDENT_AMBULATORY_CARE_PROVIDER_SITE_OTHER)

## 2023-04-24 VITALS — BP 129/82 | HR 77 | Temp 98.6°F | Resp 14

## 2023-04-24 DIAGNOSIS — S99921A Unspecified injury of right foot, initial encounter: Secondary | ICD-10-CM | POA: Diagnosis not present

## 2023-04-24 MED ORDER — ACETAMINOPHEN 325 MG PO TABS
650.0000 mg | ORAL_TABLET | Freq: Once | ORAL | Status: AC
  Administered 2023-04-24: 650 mg via ORAL

## 2023-04-24 MED ORDER — IBUPROFEN 800 MG PO TABS: 800.0000 mg | ORAL_TABLET | Freq: Three times a day (TID) | ORAL | 0 refills | Status: AC

## 2023-04-24 NOTE — Discharge Instructions (Addendum)
 I will call you if the radiologist sees something abnormal on x-ray.  Rest - try to avoid high impact activity Ice - apply for 20 minutes a few times daily Compression - use ace wrap for support Elevation - prop up on a pillow Ibuprofen - 1 tablet every 6 hours if needed for pain and swelling  Please follow with orthopedics if persisting

## 2023-04-24 NOTE — ED Provider Notes (Signed)
 [facility]    CSN: [id number] Arrival date & time: 04/24/23  1119      History   Chief Complaint Chief Complaint  Patient presents with   Foot Pain    Hit foot can't walk on it or put no type of pressure because it hurts, pain level on a scale of 1-10 it's 100 - Entered by patient    HPI Jocelyn Howard is a 23 y.o. female.  Here with right foot injury that occurred yesterday Accidentally kicked a chair. 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   Past Surgical History:  Procedure Laterality Date   LAPAROSCOPIC BILATERAL SALPINGO OOPHERECTOMY N/A 02/28/2021   Procedure: LAPAROSCOPIC RIGHT OOPHORECTOMY;  Surgeon: Tereso Newcomer, MD;  Location: [facility];  Service: Gynecology;  Laterality: N/A;    OB History     Gravida  1   Para  0   Term  0   Preterm  0   AB  1   Living  0      SAB  0   IAB  1   Ectopic  0   Multiple  0   Live Births  0            Home Medications    Prior to Admission medications   Medication Sig Start Date End Date Taking? Authorizing Provider  ibuprofen (ADVIL) 800 MG tablet Take 1 tablet (800 mg total) by mouth 3 (three) times daily. 04/24/23  Yes November Sypher, Lurena Joiner, PA-C    Family History Family History  Problem Relation Age of Onset   Healthy Mother    Diabetes Father     Social History Social History   Tobacco Use   Smoking status: Never   Smokeless tobacco: Never  Vaping Use   Vaping status: Some Days   Substances: Nicotine   Devices: unsure   Substance Use Topics   Alcohol use: Yes    Comment: occasional   Drug use: Yes    Types: Marijuana     Allergies   Patient has no known allergies.   Review of Systems Review of Systems Per HPI  Physical Exam Triage Vital Signs ED Triage Vitals [04/24/23 1132]  Encounter Vitals Group     BP 129/82     Systolic BP Percentile      Diastolic BP Percentile      Pulse Rate 77     Resp 14     Temp 98.6 F (37 C)     Temp Source Oral     SpO2 97 %     Weight      Height      Head Circumference      Peak Flow      Pain Score 5     Pain Loc      Pain Education      Exclude from Growth Chart    No data found.  Updated Vital Signs BP 129/82 (BP Location: Right Arm)   Pulse 77   Temp 98.6  F (37 C) (Oral)   Resp 14   LMP 03/29/2023 (Exact Date)   SpO2 97%   Physical Exam Vitals and nursing note reviewed.  Constitutional:      General: She is not in acute distress. HENT:     Mouth/Throat:     Pharynx: Oropharynx is clear.  Cardiovascular:     Rate and Rhythm: Normal rate and regular rhythm.     Pulses: Normal pulses.  Pulmonary:     Effort: Pulmonary effort is normal.  Musculoskeletal:        General: Tenderness present. No swelling, deformity or signs of injury. Normal range of motion.     Cervical back: Normal range of motion.     Comments: Dorsal right foot with tenderness. No obvious deformity, swelling, skin trauma. Distal sensation intact. Strong DP and PT pulses. Cap refill < 2 seconds   Skin:    Capillary Refill: Capillary refill takes less than 2 seconds.  Neurological:     Mental Status: She is alert and oriented to person, place, and time.     Gait: Gait abnormal (antalgic).     UC Treatments / Results  Labs (all labs ordered are listed, but only abnormal results are displayed) Labs Reviewed - No data to display  EKG   Radiology DG Foot Complete Right Result Date: 04/24/2023 CLINICAL DATA:  Right foot injury.  Unable to walk. EXAM:  RIGHT FOOT COMPLETE - 3+ VIEW COMPARISON:  None Available. FINDINGS: There is no evidence of fracture or dislocation. There is no evidence of arthropathy or other focal bone abnormality. Type 2 accessory navicular. Soft tissues are unremarkable. IMPRESSION: Negative. Electronically Signed   By: Obie Dredge M.D.   On: 04/24/2023 12:49    Procedures Procedures (including critical care time)  Medications Ordered in UC Medications  acetaminophen (TYLENOL) tablet 650 mg (650 mg Oral Given 04/24/23 1139)    Initial Impression / Assessment and Plan / UC Course  I have reviewed the triage vital signs and the nursing notes.  Pertinent labs & imaging results that were available during my care of the patient were reviewed by me and considered in my medical decision making (see chart for details).  Tylenol dose given for pain Right foot xray negative. Images independently reviewed by me, agree with radiology interpretation. Ace wrap applied. RICE therapy and pain control Ortho follow up if needed Work note provided   Final Clinical Impressions(s) / UC Diagnoses   Final diagnoses:  Injury of right foot, initial encounter     Discharge Instructions      I will call you if the radiologist sees something abnormal on x-ray.  Rest - try to avoid high impact activity Ice - apply for 20 minutes a few times daily Compression - use ace wrap for support Elevation - prop up on a pillow Ibuprofen - 1 tablet every 6 hours if needed for pain and swelling  Please follow with orthopedics if persisting      ED Prescriptions     Medication Sig Dispense Auth. Provider   ibuprofen (ADVIL) 800 MG tablet Take 1 tablet (800 mg total) by mouth 3 (three) times daily. 21 tablet Ranita Stjulien, Lurena Joiner, PA-C      PDMP not reviewed this encounter.   Lelon Ikard, Lurena Joiner, [place] 04/24/23 1303

## 2023-04-24 NOTE — ED Triage Notes (Signed)
 Right foot pain x 1 day. Pt states she tried to kick a ball and accidentally hit the top of her right foot on a chair.

## 2023-05-10 DIAGNOSIS — Z419 Encounter for procedure for purposes other than remedying health state, unspecified: Secondary | ICD-10-CM | POA: Diagnosis not present

## 2023-05-16 ENCOUNTER — Ambulatory Visit

## 2023-10-25 ENCOUNTER — Other Ambulatory Visit: Payer: Self-pay

## 2023-10-25 ENCOUNTER — Emergency Department (HOSPITAL_BASED_OUTPATIENT_CLINIC_OR_DEPARTMENT_OTHER)
Admission: EM | Admit: 2023-10-25 | Discharge: 2023-10-25 | Disposition: A | Discharge status: Home / Self Care | Payer: Self-pay | Attending: Emergency Medicine | Admitting: Emergency Medicine

## 2023-10-25 DIAGNOSIS — S0181XA Laceration without foreign body of other part of head, initial encounter: Secondary | ICD-10-CM | POA: Insufficient documentation

## 2023-10-25 MED ORDER — LIDOCAINE HCL (PF) 1 % IJ SOLN
5.0000 mL | Freq: Once | INTRAMUSCULAR | Status: DC
  Filled 2023-10-25: qty 5
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
## 2023-10-25 NOTE — Discharge Instructions (Signed)
 Local wound care with bacitracin and dressing changes twice daily.  Sutures are to be removed in 5 to 7 days.  Please follow-up with your primary doctor for this.  Return to the ER if you develop severe headache, seizure/convulsions, difficulty waking, difficulty with balance, or for other new and concerning symptoms.

## 2023-11-05 ENCOUNTER — Ambulatory Visit
Admission: RE | Admit: 2023-11-05 | Discharge: 2023-11-05 | Disposition: A | Discharge status: Home / Self Care | Payer: Self-pay | Source: Ambulatory Visit | Attending: Family Medicine | Admitting: Family Medicine

## 2023-11-05 ENCOUNTER — Other Ambulatory Visit: Payer: Self-pay

## 2023-11-05 VITALS — BP 128/75 | HR 65 | Temp 98.3°F | Resp 16

## 2023-11-05 DIAGNOSIS — Z4802 Encounter for removal of sutures: Secondary | ICD-10-CM

## 2023-11-05 NOTE — ED Triage Notes (Signed)
 Pt here to get sutures removed from left side of forehead.

## 2023-11-05 NOTE — ED Provider Notes (Addendum)
 [facility]    CSN: [id number] Arrival date & time: 11/05/23  1432      History   Chief Complaint Chief Complaint  Patient presents with   Suture / Staple Removal    Entered by patient    HPI Jocelyn Howard is a 23 y.o. female presents for suture removal.  Patient was seen in the emergency room on 9/27 for laceration to the left aspect of her forehead.  She had 5 interrupted sutures placed.  She reports areas healed well without drainage, swelling, fevers or chills.  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   Past Surgical History:  Procedure Laterality Date   LAPAROSCOPIC BILATERAL SALPINGO OOPHERECTOMY N/A 02/28/2021   Procedure: LAPAROSCOPIC RIGHT OOPHORECTOMY;  Surgeon: Herchel Gloris LABOR, MD;  Location: [facility];  Service: Gynecology;  Laterality: N/A;    OB History     Gravida  1   Para  0   Term  0   Preterm  0   AB  1   Living  0      SAB  0   IAB  1   Ectopic  0   Multiple  0   Live Births  0            Home Medications    Prior to Admission medications   Medication Sig Start Date End Date Taking? Authorizing Provider  ibuprofen  (ADVIL ) 800 MG tablet Take 1 tablet (800 mg total) by mouth 3 (three) times daily. 04/24/23   Rising, Asberry PA-C    Family History Family History  Problem Relation Age of Onset   Healthy Mother    Diabetes Father     Social History Social History   Tobacco Use   Smoking status: Never   Smokeless tobacco: Never  Vaping Use   Vaping status: Some Days   Substances: Nicotine   Devices:  unsure  Substance Use Topics   Alcohol use: Yes    Comment: occasional   Drug use: Not Currently    Types: Marijuana     Allergies   Patient has no known allergies.   Review of Systems Review of Systems  Skin:        Encounter for suture removal     Physical Exam Triage Vital Signs ED Triage Vitals  Encounter Vitals Group     BP 11/05/23 1448 128/75     Girls Systolic BP Percentile --      Girls Diastolic BP Percentile --      Boys Systolic BP Percentile --      Boys Diastolic BP Percentile --      Pulse Rate 11/05/23 1448 65     Resp 11/05/23 1448 16     Temp 11/05/23 1448 98.3 F (36.8 C)     Temp Source 11/05/23 1448 Oral     SpO2 11/05/23 1448 98 %     Weight --      Height --      Head Circumference --      Peak Flow --  Pain Score 11/05/23 1447 0     Pain Loc --      Pain Education --      Exclude from Growth Chart --    No data found.  Updated Vital Signs BP 128/75   Pulse 65   Temp 98.3 F (36.8 C) (Oral)   Resp 16   LMP 10/25/2023   SpO2 98%   Visual Acuity Right Eye Distance:   Left Eye Distance:   Bilateral Distance:    Right Eye Near:   Left Eye Near:    Bilateral Near:     Physical Exam Vitals and nursing note reviewed.  Constitutional:      General: She is not in acute distress.    Appearance: Normal appearance. She is not ill-appearing.  HENT:     Head: Normocephalic.      Comments: There is a V-shaped laceration to the left forehead.  5 interrupted sutures noted.  Wound is well-approximated without swelling, drainage, warmth or erythema. Eyes:     Pupils: Pupils are equal, round, and reactive to light.  Cardiovascular:     Rate and Rhythm: Normal rate.  Pulmonary:     Effort: Pulmonary effort is normal.  Skin:    General: Skin is warm and dry.  Neurological:     General: No focal deficit present.     Mental Status: She is alert and oriented to person, place, and time.  Psychiatric:        Mood and Affect: Mood  normal.        Behavior: Behavior normal.      UC Treatments / Results  Labs (all labs ordered are listed, but only abnormal results are displayed) Labs Reviewed - No data to display  EKG   Radiology No results found.  Procedures Procedures (including critical care time)  Medications Ordered in UC Medications - No data to display  Initial Impression / Assessment and Plan / UC Course  I have reviewed the triage vital signs and the nursing notes.  Pertinent labs & imaging results that were available during my care of the patient were reviewed by me and considered in my medical decision making (see chart for details).     Sutures removed by nursing staff and wound remains well-approximated.  Wound care reviewed.  Follow-up as needed. Final Clinical Impressions(s) / UC Diagnoses   Final diagnoses:  Encounter for removal of sutures     Discharge Instructions      Keep area clean and dry and monitor for any signs of infection which include redness, swelling, drainage, warmth and seek reevaluation if these occur.     ED Prescriptions   None    PDMP not reviewed this encounter.   Loreda Myla SAUNDERS, NP 11/05/23 1503    Loreda Myla SAUNDERS, NP 11/05/23 [phone]

## 2023-11-05 NOTE — Discharge Instructions (Addendum)
 Keep area clean and dry and monitor for any signs of infection which include redness, swelling, drainage, warmth and seek reevaluation if these occur.

## 2024-03-04 ENCOUNTER — Emergency Department (HOSPITAL_COMMUNITY): Admission: EM | Admit: 2024-03-04 | Discharge: 2024-03-05 | Payer: Self-pay | Source: Home / Self Care

## 2024-03-04 ENCOUNTER — Encounter (HOSPITAL_COMMUNITY): Payer: Self-pay | Admitting: Emergency Medicine

## 2024-03-04 ENCOUNTER — Other Ambulatory Visit: Payer: Self-pay
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
## 2024-03-05 ENCOUNTER — Emergency Department (HOSPITAL_COMMUNITY)

## 2024-03-05 LAB — POC URINE PREG, ED: Preg Test, Ur: NEGATIVE

## 2024-03-05 NOTE — ED Notes (Signed)
 Pt seen leaving ED [place] with her father
